# Patient Record
Sex: Male | Born: 1980 | Race: White | Hispanic: No | Marital: Single | Smoking: Former smoker
Health system: Southern US, Community
[De-identification: ages and names within clinical notes are randomized; demographics above are authoritative.]

## PROBLEM LIST (undated history)

## (undated) DIAGNOSIS — K810 Acute cholecystitis: Secondary | ICD-10-CM

## (undated) DIAGNOSIS — L03213 Periorbital cellulitis: Secondary | ICD-10-CM

## (undated) DIAGNOSIS — I10 Essential (primary) hypertension: Secondary | ICD-10-CM

## (undated) DIAGNOSIS — F988 Other specified behavioral and emotional disorders with onset usually occurring in childhood and adolescence: Secondary | ICD-10-CM

## (undated) DIAGNOSIS — J302 Other seasonal allergic rhinitis: Secondary | ICD-10-CM

## (undated) HISTORY — DX: Other seasonal allergic rhinitis: J30.2

## (undated) HISTORY — DX: Acute cholecystitis: K81.0

---

## 2012-01-27 ENCOUNTER — Encounter (HOSPITAL_COMMUNITY): Payer: Self-pay | Admitting: *Deleted

## 2012-01-27 ENCOUNTER — Emergency Department (HOSPITAL_COMMUNITY): Payer: PRIVATE HEALTH INSURANCE

## 2012-01-27 ENCOUNTER — Other Ambulatory Visit: Payer: Self-pay

## 2012-01-27 ENCOUNTER — Inpatient Hospital Stay (HOSPITAL_COMMUNITY)
Admission: EM | Admit: 2012-01-27 | Discharge: 2012-01-29 | DRG: 419 | Disposition: A | Discharge status: Home / Self Care | Payer: PRIVATE HEALTH INSURANCE | Attending: Surgery | Admitting: Surgery

## 2012-01-27 DIAGNOSIS — R0602 Shortness of breath: Secondary | ICD-10-CM | POA: Diagnosis present

## 2012-01-27 DIAGNOSIS — R1013 Epigastric pain: Secondary | ICD-10-CM

## 2012-01-27 DIAGNOSIS — K8000 Calculus of gallbladder with acute cholecystitis without obstruction: Principal | ICD-10-CM | POA: Diagnosis present

## 2012-01-27 DIAGNOSIS — K8010 Calculus of gallbladder with chronic cholecystitis without obstruction: Secondary | ICD-10-CM

## 2012-01-27 DIAGNOSIS — K810 Acute cholecystitis: Secondary | ICD-10-CM | POA: Diagnosis present

## 2012-01-27 DIAGNOSIS — R1011 Right upper quadrant pain: Secondary | ICD-10-CM | POA: Diagnosis present

## 2012-01-27 HISTORY — DX: Other specified behavioral and emotional disorders with onset usually occurring in childhood and adolescence: F98.8

## 2012-01-27 LAB — URINE MICROSCOPIC-ADD ON

## 2012-01-27 LAB — CBC
HCT: 42.6 % (ref 39.0–52.0)
Hemoglobin: 14.6 g/dL (ref 13.0–17.0)
MCHC: 34.3 g/dL (ref 30.0–36.0)
MCV: 85.7 fL (ref 78.0–100.0)
RDW: 12.5 % (ref 11.5–15.5)
WBC: 13.3 10*3/uL — ABNORMAL HIGH (ref 4.0–10.5)

## 2012-01-27 LAB — COMPREHENSIVE METABOLIC PANEL
AST: 45 U/L — ABNORMAL HIGH (ref 0–37)
BUN: 15 mg/dL (ref 6–23)
CO2: 26 mEq/L (ref 19–32)
Calcium: 10 mg/dL (ref 8.4–10.5)
Chloride: 98 mEq/L (ref 96–112)
Creatinine, Ser: 1.01 mg/dL (ref 0.50–1.35)
GFR calc Af Amer: >90 mL/min (ref >90)
GFR calc non Af Amer: >90 mL/min (ref >90)
Total Bilirubin: 0.7 mg/dL (ref 0.3–1.2)

## 2012-01-27 LAB — URINALYSIS, ROUTINE W REFLEX MICROSCOPIC
Glucose, UA: NEGATIVE mg/dL
Ketones, ur: 40 mg/dL — AB
Leukocytes, UA: NEGATIVE
Nitrite: NEGATIVE
Protein, ur: NEGATIVE mg/dL
Urobilinogen, UA: 1 mg/dL (ref 0.0–1.0)

## 2012-01-27 LAB — DIFFERENTIAL
Basophils Absolute: 0 10*3/uL (ref 0.0–0.1)
Eosinophils Relative: 0 % (ref 0–5)
Lymphocytes Relative: 8 % — ABNORMAL LOW (ref 12–46)
Monocytes Absolute: 0.6 10*3/uL (ref 0.1–1.0)
Monocytes Relative: 5 % (ref 3–12)

## 2012-01-27 LAB — LIPASE, BLOOD: Lipase: 22 U/L (ref 11–59)

## 2012-01-27 MED ORDER — MORPHINE SULFATE 4 MG/ML IJ SOLN
4.0000 mg | Freq: Once | INTRAMUSCULAR | Status: AC
  Administered 2012-01-27: 4 mg via INTRAVENOUS
  Filled 2012-01-27: qty 1

## 2012-01-27 MED ORDER — SODIUM CHLORIDE 0.9 % IV BOLUS (SEPSIS)
1000.0000 mL | Freq: Once | INTRAVENOUS | Status: AC
  Administered 2012-01-27: 1000 mL via INTRAVENOUS

## 2012-01-27 MED ORDER — DICYCLOMINE HCL 10 MG/ML IM SOLN
20.0000 mg | Freq: Once | INTRAMUSCULAR | Status: AC
  Administered 2012-01-27: 20 mg via INTRAMUSCULAR
  Filled 2012-01-27: qty 2

## 2012-01-27 MED ORDER — ONDANSETRON HCL 4 MG/2ML IJ SOLN
4.0000 mg | Freq: Once | INTRAMUSCULAR | Status: AC
  Administered 2012-01-27: 4 mg via INTRAVENOUS
  Filled 2012-01-27: qty 2

## 2012-01-27 NOTE — ED Provider Notes (Signed)
History     CSN: [id number]  Arrival date & time 01/27/12  1659   First MD Initiated Contact with Patient 01/27/12 1807      Chief Complaint  Patient presents with  . Abdominal Pain  . Shortness of Breath  . Fever    (Consider location/radiation/quality/duration/timing/severity/associated sxs/prior treatment) HPI Pt p/w abd pain mostly in the upper quad. This episode started this morning but has had prev episodes after eating in the past that are similar but do not last this long. Pt describes the pain as"indigestion". + nausea but no diarrhea, constipation, vomiting. Pt states difficult to take deep breaths due to pain in abd. No chest pain. Subjective fevers at home. No prev abd surgeries.  Past Medical History  Diagnosis Date  . ADD (attention deficit disorder)     History reviewed. No pertinent past surgical history.  History reviewed. No pertinent family history.  History  Substance Use Topics  . Smoking status: Never Smoker   . Smokeless tobacco: Never Used  . Alcohol Use: Yes     occasional      Review of Systems  Constitutional: Negative for fever and chills.  HENT: Negative for neck pain.   Respiratory: Positive for shortness of breath. Negative for chest tightness.   Cardiovascular: Negative for chest pain, palpitations and leg swelling.  Gastrointestinal: Positive for nausea, abdominal pain and abdominal distention. Negative for vomiting, diarrhea and constipation.  Genitourinary: Negative for dysuria and hematuria.  Musculoskeletal: Positive for back pain.  Skin: Negative for rash and wound.  Neurological: Negative for dizziness, weakness, numbness and headaches.    Allergies  Review of patient's allergies indicates no known allergies.  Home Medications   Current Outpatient Rx  Name Route Sig Dispense Refill  . ACETAMINOPHEN 160 MG/5ML PO SUSP Oral Take 500 mg by mouth every 4 (four) hours as needed. For pain    . CALCIUM CARBONATE ANTACID 500 MG  PO CHEW Oral Chew 1 tablet by mouth daily.    Marland Kitchen LISDEXAMFETAMINE DIMESYLATE 50 MG PO CAPS Oral Take 50 mg by mouth every morning.    . ADULT MULTIVITAMIN W/MINERALS CH Oral Take 1 tablet by mouth daily.    . AMPHETAMINE-DEXTROAMPHET ER 15 MG PO CP24 Oral Take 15 mg by mouth See admin instructions. Take 2 capsules in the morning and 2 capsules at noon    . AMPHETAMINE-DEXTROAMPHETAMINE 20 MG PO TABS Oral Take 20 mg by mouth 4 (four) times daily.    Marland Kitchen HYDROMORPHONE HCL 2 MG PO TABS Oral Take 1 tablet (2 mg total) by mouth every 12 (twelve) hours as needed for pain. 20 tablet 0    BP 125/83  Pulse 89  Temp(Src) 98.4 F (36.9 C) (Axillary)  Resp 18  Ht 5\' 10"  (1.778 m)  Wt 185 lb (83.915 kg)  BMI 26.54 kg/m2  SpO2 97%  Physical Exam  Nursing note and vitals reviewed. Constitutional: He is oriented to person, place, and time. He appears well-developed and well-nourished. No distress.  HENT:  Head: Normocephalic and atraumatic.  Mouth/Throat: Oropharynx is clear and moist.  Eyes: EOM are normal. Pupils are equal, round, and reactive to light.  Neck: Normal range of motion. Neck supple.  Cardiovascular: Normal rate and regular rhythm.   Pulmonary/Chest: Effort normal and breath sounds normal. No respiratory distress. He has no wheezes. He has no rales. He exhibits no tenderness.  Abdominal: Soft. Bowel sounds are normal. He exhibits distension. There is tenderness (RUQ ttp>epigastric TTP. no rebound or guarding).  There is no rebound and no guarding.  Musculoskeletal: Normal range of motion. He exhibits no edema and no tenderness.  Neurological: He is alert and oriented to person, place, and time.  Skin: Skin is warm and dry. No rash noted. No erythema.  Psychiatric: He has a normal mood and affect. His behavior is normal.    ED Course  Procedures (including critical care time)  Labs Reviewed  CBC - Abnormal; Notable for the following:    WBC 13.3 (*)    All other components within  normal limits  DIFFERENTIAL - Abnormal; Notable for the following:    Neutrophils Relative 87 (*)    Neutro Abs 11.6 (*)    Lymphocytes Relative 8 (*)    All other components within normal limits  COMPREHENSIVE METABOLIC PANEL - Abnormal; Notable for the following:    Glucose, Bld 138 (*)    AST 45 (*)    ALT 76 (*)    Alkaline Phosphatase 150 (*)    All other components within normal limits  URINALYSIS, ROUTINE W REFLEX MICROSCOPIC - Abnormal; Notable for the following:    APPearance TURBID (*)    Ketones, ur 40 (*)    All other components within normal limits  SURGICAL PCR SCREEN - Abnormal; Notable for the following:    Staphylococcus aureus POSITIVE (*)    All other components within normal limits  LIPASE, BLOOD  URINE MICROSCOPIC-ADD ON  SURGICAL PATHOLOGY  LAB REPORT - SCANNED   Dg Cholangiogram Operative  01/28/2012  *RADIOLOGY REPORT*  Clinical Data:   Cholelithiasis  INTRAOPERATIVE CHOLANGIOGRAM  Technique:  Cholangiographic images from the C-arm fluoroscopic device were submitted for interpretation post-operatively.  Please see the procedural report for the amount of contrast and the fluoroscopy time utilized.  Comparison:  None  Findings:  No persistent filling defects in the common duct. Intrahepatic ducts are incompletely visualized, appearing decompressed centrally. Contrast passes into the duodenum.  IMPRESSION  Negative for retained common duct stone.  Original Report Authenticated By: Osa Craver, M.D.     1. Acute cholecystitis       MDM  Surgery to see in ED.        Loren Racer, MD 01/30/12 [id number]

## 2012-01-27 NOTE — ED Notes (Addendum)
Pt present to ED sinus brady, HTN, feeling hot, and tachypnea.  c/o cramping central and epigastric, abdominal pain 9/10, continuous, bowel sound present, not passing gas, abdominal non-distended but firm. Slight tenderness upon palpation. LBM yesterday, no diarrhea, positive nausea, no vomiting. No abdominal surgery in past, all organ present. HR 45-56, baseline HR unknown, BP 183/96, denied hx of HTN, breath sound clear.

## 2012-01-27 NOTE — ED Notes (Signed)
Pt from home with reports of LLQ pain that radiates up through chest and into back, EKG done shortly upon arrival and shown to Dr. Ranae Palms. Pt also c/o shortness of breath and chills, all symptoms started this morning and pt also endorses similar symptoms in the past but not to this extent.

## 2012-01-27 NOTE — ED Notes (Signed)
Dr  Yelverton in room.  

## 2012-01-27 NOTE — ED Notes (Signed)
[place] at bedside. Mother also at bedside

## 2012-01-28 ENCOUNTER — Other Ambulatory Visit: Payer: Self-pay

## 2012-01-28 ENCOUNTER — Encounter (HOSPITAL_COMMUNITY): Payer: Self-pay | Admitting: Anesthesiology

## 2012-01-28 ENCOUNTER — Inpatient Hospital Stay (HOSPITAL_COMMUNITY): Payer: PRIVATE HEALTH INSURANCE

## 2012-01-28 ENCOUNTER — Encounter (HOSPITAL_COMMUNITY): Payer: Self-pay | Admitting: General Surgery

## 2012-01-28 ENCOUNTER — Inpatient Hospital Stay (HOSPITAL_COMMUNITY): Payer: PRIVATE HEALTH INSURANCE | Admitting: Anesthesiology

## 2012-01-28 ENCOUNTER — Encounter (HOSPITAL_COMMUNITY): Admission: EM | Disposition: A | Discharge status: Home / Self Care | Payer: Self-pay | Source: Home / Self Care

## 2012-01-28 DIAGNOSIS — K810 Acute cholecystitis: Secondary | ICD-10-CM

## 2012-01-28 HISTORY — PX: CHOLECYSTECTOMY: SHX55

## 2012-01-28 HISTORY — DX: Acute cholecystitis: K81.0

## 2012-01-28 LAB — SURGICAL PCR SCREEN: Staphylococcus aureus: POSITIVE — AB

## 2012-01-28 SURGERY — LAPAROSCOPIC CHOLECYSTECTOMY WITH INTRAOPERATIVE CHOLANGIOGRAM
Anesthesia: General | Site: Abdomen

## 2012-01-28 MED ORDER — HEPARIN SODIUM (PORCINE) 5000 UNIT/ML IJ SOLN
5000.0000 [IU] | Freq: Three times a day (TID) | INTRAMUSCULAR | Status: DC
  Administered 2012-01-28 – 2012-01-29 (×3): 5000 [IU] via SUBCUTANEOUS
  Filled 2012-01-28 (×6): qty 1

## 2012-01-28 MED ORDER — ONDANSETRON HCL 4 MG/2ML IJ SOLN: 4.0000 mg | Freq: Four times a day (QID) | INTRAMUSCULAR | Status: DC | PRN

## 2012-01-28 MED ORDER — ROCURONIUM BROMIDE 100 MG/10ML IV SOLN
INTRAVENOUS | Status: DC | PRN
  Administered 2012-01-28: 10 mg via INTRAVENOUS
  Administered 2012-01-28: 30 mg via INTRAVENOUS

## 2012-01-28 MED ORDER — MIDAZOLAM HCL 5 MG/5ML IJ SOLN
INTRAMUSCULAR | Status: DC | PRN
  Administered 2012-01-28: 2 mg via INTRAVENOUS

## 2012-01-28 MED ORDER — POTASSIUM CHLORIDE IN NACL 20-0.9 MEQ/L-% IV SOLN
INTRAVENOUS | Status: DC
  Administered 2012-01-28: 03:00:00 via INTRAVENOUS
  Filled 2012-01-28 (×4): qty 1000

## 2012-01-28 MED ORDER — MORPHINE SULFATE 2 MG/ML IJ SOLN
2.0000 mg | INTRAMUSCULAR | Status: DC | PRN
  Administered 2012-01-28 (×3): 4 mg via INTRAVENOUS
  Filled 2012-01-28 (×3): qty 2

## 2012-01-28 MED ORDER — SODIUM CHLORIDE 0.9 % IV SOLN
1.0000 g | INTRAVENOUS | Status: DC
  Administered 2012-01-28 – 2012-01-29 (×2): 1 g via INTRAVENOUS
  Filled 2012-01-28 (×2): qty 1

## 2012-01-28 MED ORDER — FENTANYL CITRATE 0.05 MG/ML IJ SOLN
INTRAMUSCULAR | Status: AC
  Filled 2012-01-28: qty 2

## 2012-01-28 MED ORDER — DEXAMETHASONE SODIUM PHOSPHATE 10 MG/ML IJ SOLN
INTRAMUSCULAR | Status: DC | PRN
  Administered 2012-01-28: 10 mg via INTRAVENOUS

## 2012-01-28 MED ORDER — ONDANSETRON HCL 4 MG/2ML IJ SOLN
INTRAMUSCULAR | Status: DC | PRN
  Administered 2012-01-28: 4 mg via INTRAVENOUS

## 2012-01-28 MED ORDER — ACETAMINOPHEN 10 MG/ML IV SOLN
INTRAVENOUS | Status: DC | PRN
  Administered 2012-01-28: 1000 mg via INTRAVENOUS

## 2012-01-28 MED ORDER — FENTANYL CITRATE 0.05 MG/ML IJ SOLN
INTRAMUSCULAR | Status: DC | PRN
  Administered 2012-01-28: 50 ug via INTRAVENOUS
  Administered 2012-01-28: 100 ug via INTRAVENOUS
  Administered 2012-01-28 (×3): 50 ug via INTRAVENOUS

## 2012-01-28 MED ORDER — BUPIVACAINE HCL (PF) 0.25 % IJ SOLN
INTRAMUSCULAR | Status: AC
  Filled 2012-01-28: qty 30

## 2012-01-28 MED ORDER — SUCCINYLCHOLINE CHLORIDE 20 MG/ML IJ SOLN
INTRAMUSCULAR | Status: DC | PRN
  Administered 2012-01-28: 100 mg via INTRAVENOUS

## 2012-01-28 MED ORDER — ACETAMINOPHEN 10 MG/ML IV SOLN
INTRAVENOUS | Status: AC
  Filled 2012-01-28: qty 100

## 2012-01-28 MED ORDER — LIDOCAINE HCL (CARDIAC) 20 MG/ML IV SOLN
INTRAVENOUS | Status: DC | PRN
  Administered 2012-01-28: 50 mg via INTRAVENOUS

## 2012-01-28 MED ORDER — LACTATED RINGERS IR SOLN
Status: DC | PRN
  Administered 2012-01-28: 1

## 2012-01-28 MED ORDER — HYDROMORPHONE HCL PF 2 MG/ML IJ SOLN
2.0000 mg | INTRAMUSCULAR | Status: DC | PRN
  Administered 2012-01-28 – 2012-01-29 (×7): 2 mg via INTRAVENOUS
  Filled 2012-01-28 (×7): qty 1

## 2012-01-28 MED ORDER — LACTATED RINGERS IV SOLN
INTRAVENOUS | Status: DC | PRN
  Administered 2012-01-28 (×2): via INTRAVENOUS

## 2012-01-28 MED ORDER — MORPHINE SULFATE 4 MG/ML IJ SOLN
4.0000 mg | Freq: Once | INTRAMUSCULAR | Status: AC
  Administered 2012-01-28: 4 mg via INTRAVENOUS
  Filled 2012-01-28: qty 1

## 2012-01-28 MED ORDER — FENTANYL CITRATE 0.05 MG/ML IJ SOLN
25.0000 ug | INTRAMUSCULAR | Status: DC | PRN
  Administered 2012-01-28 (×2): 50 ug via INTRAVENOUS

## 2012-01-28 MED ORDER — IOHEXOL 300 MG/ML  SOLN
INTRAMUSCULAR | Status: AC
  Filled 2012-01-28: qty 1

## 2012-01-28 MED ORDER — LACTATED RINGERS IV SOLN: INTRAVENOUS | Status: DC

## 2012-01-28 MED ORDER — PANTOPRAZOLE SODIUM 40 MG IV SOLR
40.0000 mg | Freq: Every day | INTRAVENOUS | Status: DC
  Administered 2012-01-28 (×2): 40 mg via INTRAVENOUS
  Filled 2012-01-28 (×3): qty 40

## 2012-01-28 MED ORDER — PROPOFOL 10 MG/ML IV BOLUS
INTRAVENOUS | Status: DC | PRN
  Administered 2012-01-28: 170 mg via INTRAVENOUS

## 2012-01-28 MED ORDER — PROMETHAZINE HCL 25 MG/ML IJ SOLN: 6.2500 mg | INTRAMUSCULAR | Status: DC | PRN

## 2012-01-28 MED ORDER — OXYCODONE-ACETAMINOPHEN 5-325 MG PO TABS
1.0000 | ORAL_TABLET | ORAL | Status: DC | PRN
  Administered 2012-01-28: 1 via ORAL
  Filled 2012-01-28: qty 1

## 2012-01-28 MED ORDER — SODIUM CHLORIDE 0.9 % IJ SOLN
INTRAMUSCULAR | Status: DC | PRN
  Administered 2012-01-28: 11:00:00

## 2012-01-28 MED ORDER — GLYCOPYRROLATE 0.2 MG/ML IJ SOLN
INTRAMUSCULAR | Status: DC | PRN
  Administered 2012-01-28: 0.6 mg via INTRAVENOUS

## 2012-01-28 MED ORDER — IOHEXOL 300 MG/ML  SOLN
100.0000 mL | Freq: Once | INTRAMUSCULAR | Status: AC | PRN
  Administered 2012-01-28: 100 mL via INTRAVENOUS

## 2012-01-28 MED ORDER — BUPIVACAINE HCL (PF) 0.25 % IJ SOLN
INTRAMUSCULAR | Status: DC | PRN
  Administered 2012-01-28: 10 mL

## 2012-01-28 MED ORDER — NEOSTIGMINE METHYLSULFATE 1 MG/ML IJ SOLN
INTRAMUSCULAR | Status: DC | PRN
  Administered 2012-01-28: 4 mg via INTRAVENOUS

## 2012-01-28 SURGICAL SUPPLY — 44 items
ADH SKN CLS APL DERMABOND .7 (GAUZE/BANDAGES/DRESSINGS)
APPLIER CLIP ROT 10 11.4 M/L (STAPLE) ×2
APR CLP MED LRG 11.4X10 (STAPLE) ×1
BAG SPEC RTRVL LRG 6X4 10 (ENDOMECHANICALS) ×1
CANISTER SUCTION 2500CC (MISCELLANEOUS) ×2 IMPLANT
CATH REDDICK CHOLANGI 4FR 50CM (CATHETERS) IMPLANT
CHLORAPREP W/TINT 10.5 ML (MISCELLANEOUS) ×2 IMPLANT
CLIP APPLIE ROT 10 11.4 M/L (STAPLE) ×1 IMPLANT
CLOTH BEACON ORANGE TIMEOUT ST (SAFETY) ×2 IMPLANT
COVER MAYO STAND STRL (DRAPES) ×1 IMPLANT
DECANTER SPIKE VIAL GLASS SM (MISCELLANEOUS) ×2 IMPLANT
DERMABOND ADVANCED (GAUZE/BANDAGES/DRESSINGS)
DERMABOND ADVANCED .7 DNX12 (GAUZE/BANDAGES/DRESSINGS) IMPLANT
DRAPE C-ARM 42X72 X-RAY (DRAPES) ×1 IMPLANT
DRAPE LAPAROSCOPIC ABDOMINAL (DRAPES) ×2 IMPLANT
ELECT REM PT RETURN 9FT ADLT (ELECTROSURGICAL) ×2
ELECTRODE REM PT RTRN 9FT ADLT (ELECTROSURGICAL) ×1 IMPLANT
FILTER SMOKE EVAC LAPAROSHD (FILTER) ×2 IMPLANT
GLOVE BIOGEL PI IND STRL 7.0 (GLOVE) ×1 IMPLANT
GLOVE BIOGEL PI INDICATOR 7.0 (GLOVE) ×2
GLOVE EUDERMIC 7 POWDERFREE (GLOVE) ×2 IMPLANT
GOWN STRL NON-REIN LRG LVL3 (GOWN DISPOSABLE) ×3 IMPLANT
GOWN STRL REIN XL XLG (GOWN DISPOSABLE) ×4 IMPLANT
HEMOSTAT SNOW SURGICEL 2X4 (HEMOSTASIS) ×1 IMPLANT
HEMOSTAT SURGICEL 4X8 (HEMOSTASIS) IMPLANT
IV CATH 14GX2 1/4 (CATHETERS) IMPLANT
IV SET EXT 30 76VOL 4 MALE LL (IV SETS) IMPLANT
KIT BASIN OR (CUSTOM PROCEDURE TRAY) ×2 IMPLANT
NS IRRIG 1000ML POUR BTL (IV SOLUTION) ×2 IMPLANT
POUCH SPECIMEN RETRIEVAL 10MM (ENDOMECHANICALS) ×1 IMPLANT
SCISSORS LAP 5X35 DISP (ENDOMECHANICALS) ×1 IMPLANT
SET CHOLANGIOGRAPH MIX (MISCELLANEOUS) ×2 IMPLANT
SET IRRIG TUBING LAPAROSCOPIC (IRRIGATION / IRRIGATOR) ×2 IMPLANT
SLEEVE Z-THREAD 5X100MM (TROCAR) ×2 IMPLANT
SOLUTION ANTI FOG 6CC (MISCELLANEOUS) ×2 IMPLANT
STOPCOCK K 69 2C6206 (IV SETS) IMPLANT
STRIP CLOSURE SKIN 1/4X3 (GAUZE/BANDAGES/DRESSINGS) ×4 IMPLANT
SUT MNCRL AB 4-0 PS2 18 (SUTURE) ×2 IMPLANT
TOWEL OR 17X26 10 PK STRL BLUE (TOWEL DISPOSABLE) ×2 IMPLANT
TRAY LAP CHOLE (CUSTOM PROCEDURE TRAY) ×2 IMPLANT
TROCAR XCEL BLUNT TIP 100MML (ENDOMECHANICALS) ×2 IMPLANT
TROCAR Z-THREAD FIOS 11X100 BL (TROCAR) ×2 IMPLANT
TROCAR Z-THREAD FIOS 5X100MM (TROCAR) ×2 IMPLANT
TUBING INSUFFLATION 10FT LAP (TUBING) ×2 IMPLANT

## 2012-01-28 NOTE — Transfer of Care (Signed)
Immediate Anesthesia Transfer of Care Note  Patient: David Cordova  Procedure(s) Performed: Procedure(s) (LRB): LAPAROSCOPIC CHOLECYSTECTOMY WITH INTRAOPERATIVE CHOLANGIOGRAM (N/A)  Patient Location: PACU  Anesthesia Type: General  Level of Consciousness: sedated  Airway & Oxygen Therapy: Patient Spontanous Breathing and Patient connected to face mask oxygen  Post-op Assessment: Report given to PACU RN and Post -op Vital signs reviewed and stable  Post vital signs: Reviewed and stable  Complications: No apparent anesthesia complications

## 2012-01-28 NOTE — Op Note (Signed)
David Cordova 1981-07-26 [id number] 01/27/2012  Preoperative diagnosis: Acute cholecystitis  Postoperative diagnosis: Acute cholecystitis  Procedure: Laparoscopic cholecystectomy with intraoperative cholangiogram  Surgeon: Currie Paris, MD, FACS  Assistant surgeon: Dr. Avel Peace   Anesthesia: General  Clinical History and Indications: This patient presented to the emergency department yesterday with signs and symptoms of acute cholecystitis. There is not clearly gallstones but he had what appeared to be sludge in the gallbladder and some pericholecystic fluid on CT scan.  Description of procedure: The patient was seen in the preoperative area. I reviewed the plans for the procedure with him as well as the risks and complications. He had no further questions.  The patient was taken to the operating room. After satisfactory general endotracheal anesthesia had been obtained the abdomen was prepped and draped. A time out was done.  0.25% plain Marcaine was used at all incisions. I made an umbilical incision, identified the fascia and opened that, and entered the peritoneal cavity under direct vision. A 0 Vicryl pursestring suture was placed and the Hasson cannula was introduced under direct vision and secured with the pursestring. The abdomen was inflated to 15 cm.  The camera was placed and there were no gross abnormalities. The patient was then placed in reverse Trendelenburg and tilted to the left. A 10/11 trocar was placed in the epigastrium and two 5 mm trochars placed laterally all under direct vision.  The gallbladder was acutely inflamed and could not be grasped so I decompressed it with a needle aspirator. The gallbladder was retracted over the liver and the peritoneum opened over the triangle of cola. There is marked edema present. However I was able to use some blunt dissection and hydrodissection identify the cystic artery and duct. I thought the artery was anterior  branch. I placed a clip on the anterior branch of the artery and the cystic duct.  An intraoperative cholangiogram was then performed. A Cook catheter was introduced percutaneously and placed in the cystic duct. The cholangiogram showed good filling of the common duct and hepatic radicals and free flow into the duodenum. No abnormalities were noted.  The catheter was removed and 3 clips placed on the stay side of the cystic duct. The duct was then divided.  Additional clips are placed on the cystic artery and it was divided. The gallbladder was then removed from below to above the coagulation current of the cautery.Some other arterial branches to the Gallbladder were divided. It was then placed in a bag to be retrieved later.  The abdomen was irrigated and a check for hemostasis along the bed of the gallbladder made. Once everything appeared to be dry I placed some Surgicel on the bed of the gallbladder. we were able to move the camera to the epigastric port and removed the gallbladder through the umbilical port.  The abdomen was reinsufflated and a final check for hemostasis made. There is no evidence of bleeding or bile leakage. The lateral ports were removed under direct vision and there was no bleeding. The umbilical site was closed with a pursestring, watching with the camera in the epigastric port. The abdomen was then deflated through the epigastric port and that was removed. Skin was closed with 4-0 Monocryl subcuticular and Dermabond.  The patient tolerated the procedure well. There were no operative complications. EBL was minimal. All counts were correct.  Currie Paris, MD, FACS 01/28/2012 11:36 AM

## 2012-01-28 NOTE — ED Notes (Signed)
Patient transported to CT 

## 2012-01-28 NOTE — Progress Notes (Signed)
<principal problem not specified>  Subjective: Still with abdominal pain, primarily RUQ, no nausea, not improved from admission  Objective: Vital signs in last 24 hours: Temp:  [97.4 F (36.3 C)-99.1 F (37.3 C)] 99.1 F (37.3 C) (04/08 0557) Pulse Rate:  [42-114] 95  (04/08 0557) Resp:  [13-25] 18  (04/08 0557) BP: (143-189)/(90-110) 168/98 mmHg (04/08 0557) SpO2:  [95 %-100 %] 95 % (04/08 0557) Weight:  [185 lb (83.915 kg)] 185 lb (83.915 kg) (04/08 0349) Last BM Date: 01/27/12  Intake/Output from previous day: 04/07 0701 - 04/08 0700 In: 425 [I.V.:375; IV Piggyback:50] Out: 225 [Urine:225] Intake/Output this shift:    General appearance: alert, cooperative, appears stated age and mild distress GI: abnormal findings:  Tender mainly RUQ with some voluntary guarding  Lab Results:  Results for orders placed during the hospital encounter of 01/27/12 (from the past 24 hour(s))  CBC     Status: Abnormal   Collection Time   01/27/12  7:17 PM      Component Value Range   WBC 13.3 (*) 4.0 - 10.5 (K/uL)   RBC 4.97  4.22 - 5.81 (MIL/uL)   Hemoglobin 14.6  13.0 - 17.0 (g/dL)   HCT 11.9  14.7 - 82.9 (%)   MCV 85.7  78.0 - 100.0 (fL)   MCH 29.4  26.0 - 34.0 (pg)   MCHC 34.3  30.0 - 36.0 (g/dL)   RDW 56.2  13.0 - 86.5 (%)   Platelets 231  150 - 400 (K/uL)  DIFFERENTIAL     Status: Abnormal   Collection Time   01/27/12  7:17 PM      Component Value Range   Neutrophils Relative 87 (*) 43 - 77 (%)   Neutro Abs 11.6 (*) 1.7 - 7.7 (K/uL)   Lymphocytes Relative 8 (*) 12 - 46 (%)   Lymphs Abs 1.1  0.7 - 4.0 (K/uL)   Monocytes Relative 5  3 - 12 (%)   Monocytes Absolute 0.6  0.1 - 1.0 (K/uL)   Eosinophils Relative 0  0 - 5 (%)   Eosinophils Absolute 0.0  0.0 - 0.7 (K/uL)   Basophils Relative 0  0 - 1 (%)   Basophils Absolute 0.0  0.0 - 0.1 (K/uL)  COMPREHENSIVE METABOLIC PANEL     Status: Abnormal   Collection Time   01/27/12  7:17 PM      Component Value Range   Sodium 136  135 -  145 (mEq/L)   Potassium 3.6  3.5 - 5.1 (mEq/L)   Chloride 98  96 - 112 (mEq/L)   CO2 26  19 - 32 (mEq/L)   Glucose, Bld 138 (*) 70 - 99 (mg/dL)   BUN 15  6 - 23 (mg/dL)   Creatinine, Ser 7.84  0.50 - 1.35 (mg/dL)   Calcium 69.6  8.4 - 10.5 (mg/dL)   Total Protein 7.9  6.0 - 8.3 (g/dL)   Albumin 4.7  3.5 - 5.2 (g/dL)   AST 45 (*) 0 - 37 (U/L)   ALT 76 (*) 0 - 53 (U/L)   Alkaline Phosphatase 150 (*) 39 - 117 (U/L)   Total Bilirubin 0.7  0.3 - 1.2 (mg/dL)   GFR calc non Af Amer >90  >90 (mL/min)   GFR calc Af Amer >90  >90 (mL/min)  LIPASE, BLOOD     Status: Normal   Collection Time   01/27/12  7:17 PM      Component Value Range   Lipase 22  11 - 59 (  U/L)  URINALYSIS, ROUTINE W REFLEX MICROSCOPIC     Status: Abnormal   Collection Time   01/27/12  7:37 PM      Component Value Range   Color, Urine YELLOW  YELLOW    APPearance TURBID (*) CLEAR    Specific Gravity, Urine 1.024  1.005 - 1.030    pH 7.5  5.0 - 8.0    Glucose, UA NEGATIVE  NEGATIVE (mg/dL)   Hgb urine dipstick NEGATIVE  NEGATIVE    Bilirubin Urine NEGATIVE  NEGATIVE    Ketones, ur 40 (*) NEGATIVE (mg/dL)   Protein, ur NEGATIVE  NEGATIVE (mg/dL)   Urobilinogen, UA 1.0  0.0 - 1.0 (mg/dL)   Nitrite NEGATIVE  NEGATIVE    Leukocytes, UA NEGATIVE  NEGATIVE   URINE MICROSCOPIC-ADD ON     Status: Normal   Collection Time   01/27/12  7:37 PM      Component Value Range   WBC, UA 0-2  <3 (WBC/hpf)   Urine-Other AMORPHOUS URATES/PHOSPHATES       Studies/Results Radiology     MEDS, Scheduled    . dicyclomine  20 mg Intramuscular Once  . ertapenem (INVANZ) IV  1 g Intravenous Q24H  .  morphine injection  4 mg Intravenous Once  .  morphine injection  4 mg Intravenous Once  .  morphine injection  4 mg Intravenous Once  .  morphine injection  4 mg Intravenous Once  . ondansetron  4 mg Intravenous Once  . pantoprazole (PROTONIX) IV  40 mg Intravenous QHS  . sodium chloride  1,000 mL Intravenous Once      Assessment: <principal problem not specified> Probably acute cholecystitits  Plan: I have reviewed his labs, xrays and progress notes. This is most consistent with an acute cholecystitis. He has had mild symptoms for about a year, now an acute exacerbation. I have recommended cholecystectomy. I have discussed the indications for laparoscopic cholecystectomy with him. We have discussed the risks of surgery, including general risks such as bleeding, infection, lung and heart issues etc. We have also discussed the potential for injuries to other organs, bile duct leaks, and other unexpected events. We have also talked about the fact that this may need to be converted to open under certain circumstances. We discussed the typical post op recovery and the fact that there is a good likelihood of improvement in symptoms and return to normal activity.  He understands this and wishes to proceed to schedule surgery. I believe all of his questions have been answered.     LOS: 1 day    Currie Paris, MD, [facility], [place] [phone]   01/28/2012 7:37 AM

## 2012-01-28 NOTE — H&P (Signed)
David Cordova is an 31 y.o. male.   Chief Complaint: abdominal pain HPI: patient is a 31 year old male who presents to the emergency room with acute abdominal pain. He describes the onset about 18 hours ago of gradually worsening pressure-like pain in his mid abdomen that gradually radiates up toward his chest and both costal margins. He states that for about 2 years he has had milder episodes of similar pain which will last for a few hours and then go away. These are only occurring every month or 2. This is more severe a long-lasting. It is associated with nausea but no vomiting. Bowel movements have been normal. He denies fever chills or jaundice. No particular alleviating or exacerbating factors.  Past Medical History  Diagnosis Date  . ADD (attention deficit disorder)     History reviewed. No pertinent past surgical history.  Current Facility-Administered Medications  Medication Dose Route Frequency Provider Last Rate Last Dose  . dicyclomine (BENTYL) injection 20 mg  20 mg Intramuscular Once Loren Racer, MD   20 mg at 01/27/12 1848  . iohexol (OMNIPAQUE) 300 MG/ML solution 100 mL  100 mL Intravenous Once PRN Medication Radiologist, MD   100 mL at 01/28/12 0040  . morphine 4 MG/ML injection 4 mg  4 mg Intravenous Once Loren Racer, MD   4 mg at 01/27/12 1846  . morphine 4 MG/ML injection 4 mg  4 mg Intravenous Once Loren Racer, MD   4 mg at 01/27/12 2053  . morphine 4 MG/ML injection 4 mg  4 mg Intravenous Once Loren Racer, MD   4 mg at 01/27/12 2322  . morphine 4 MG/ML injection 4 mg  4 mg Intravenous Once Loren Racer, MD   4 mg at 01/28/12 0123  . ondansetron (ZOFRAN) injection 4 mg  4 mg Intravenous Once Loren Racer, MD   4 mg at 01/27/12 1845  . sodium chloride 0.9 % bolus 1,000 mL  1,000 mL Intravenous Once Loren Racer, MD   1,000 mL at 01/27/12 1843   Current Outpatient Prescriptions  Medication Sig Dispense Refill  . acetaminophen (TYLENOL) 160 MG/5ML  suspension Take 500 mg by mouth every 4 (four) hours as needed. For pain      . calcium carbonate (TUMS - DOSED IN MG ELEMENTAL CALCIUM) 500 MG chewable tablet Chew 1 tablet by mouth daily.      Marland Kitchen lisdexamfetamine (VYVANSE) 50 MG capsule Take 50 mg by mouth every morning.      . Multiple Vitamin (MULITIVITAMIN WITH MINERALS) TABS Take 1 tablet by mouth daily.      Marland Kitchen amphetamine-dextroamphetamine (ADDERALL XR) 15 MG 24 hr capsule Take 15 mg by mouth See admin instructions. Take 2 capsules in the morning and 2 capsules at noon      . amphetamine-dextroamphetamine (ADDERALL) 20 MG tablet Take 20 mg by mouth 4 (four) times daily.        Social History:  reports that he has never smoked. He has never used smokeless tobacco. He reports that he drinks alcohol. He reports that he does not use illicit drugs.  Allergies: No Known Allergies  Medications Prior to Admission  Medication Dose Route Frequency Provider Last Rate Last Dose  . dicyclomine (BENTYL) injection 20 mg  20 mg Intramuscular Once Loren Racer, MD   20 mg at 01/27/12 1848  . iohexol (OMNIPAQUE) 300 MG/ML solution 100 mL  100 mL Intravenous Once PRN Medication Radiologist, MD   100 mL at 01/28/12 0040  . morphine 4 MG/ML  injection 4 mg  4 mg Intravenous Once Loren Racer, MD   4 mg at 01/27/12 1846  . morphine 4 MG/ML injection 4 mg  4 mg Intravenous Once Loren Racer, MD   4 mg at 01/27/12 2053  . morphine 4 MG/ML injection 4 mg  4 mg Intravenous Once Loren Racer, MD   4 mg at 01/27/12 2322  . morphine 4 MG/ML injection 4 mg  4 mg Intravenous Once Loren Racer, MD   4 mg at 01/28/12 0123  . ondansetron (ZOFRAN) injection 4 mg  4 mg Intravenous Once Loren Racer, MD   4 mg at 01/27/12 1845  . sodium chloride 0.9 % bolus 1,000 mL  1,000 mL Intravenous Once Loren Racer, MD   1,000 mL at 01/27/12 1843   No current outpatient prescriptions on file as of 01/27/2012.    Results for orders placed during the hospital  encounter of 01/27/12 (from the past 48 hour(s))  CBC     Status: Abnormal   Collection Time   01/27/12  7:17 PM      Component Value Range Comment   WBC 13.3 (*) 4.0 - 10.5 (K/uL)    RBC 4.97  4.22 - 5.81 (MIL/uL)    Hemoglobin 14.6  13.0 - 17.0 (g/dL)    HCT 56.2  13.0 - 86.5 (%)    MCV 85.7  78.0 - 100.0 (fL)    MCH 29.4  26.0 - 34.0 (pg)    MCHC 34.3  30.0 - 36.0 (g/dL)    RDW 78.4  69.6 - 29.5 (%)    Platelets 231  150 - 400 (K/uL)   DIFFERENTIAL     Status: Abnormal   Collection Time   01/27/12  7:17 PM      Component Value Range Comment   Neutrophils Relative 87 (*) 43 - 77 (%)    Neutro Abs 11.6 (*) 1.7 - 7.7 (K/uL)    Lymphocytes Relative 8 (*) 12 - 46 (%)    Lymphs Abs 1.1  0.7 - 4.0 (K/uL)    Monocytes Relative 5  3 - 12 (%)    Monocytes Absolute 0.6  0.1 - 1.0 (K/uL)    Eosinophils Relative 0  0 - 5 (%)    Eosinophils Absolute 0.0  0.0 - 0.7 (K/uL)    Basophils Relative 0  0 - 1 (%)    Basophils Absolute 0.0  0.0 - 0.1 (K/uL)   COMPREHENSIVE METABOLIC PANEL     Status: Abnormal   Collection Time   01/27/12  7:17 PM      Component Value Range Comment   Sodium 136  135 - 145 (mEq/L)    Potassium 3.6  3.5 - 5.1 (mEq/L)    Chloride 98  96 - 112 (mEq/L)    CO2 26  19 - 32 (mEq/L)    Glucose, Bld 138 (*) 70 - 99 (mg/dL)    BUN 15  6 - 23 (mg/dL)    Creatinine, Ser 2.84  0.50 - 1.35 (mg/dL)    Calcium 13.2  8.4 - 10.5 (mg/dL)    Total Protein 7.9  6.0 - 8.3 (g/dL)    Albumin 4.7  3.5 - 5.2 (g/dL)    AST 45 (*) 0 - 37 (U/L)    ALT 76 (*) 0 - 53 (U/L)    Alkaline Phosphatase 150 (*) 39 - 117 (U/L)    Total Bilirubin 0.7  0.3 - 1.2 (mg/dL)    GFR calc non Af Amer >90  >  90 (mL/min)    GFR calc Af Amer >90  >90 (mL/min)   LIPASE, BLOOD     Status: Normal   Collection Time   01/27/12  7:17 PM      Component Value Range Comment   Lipase 22  11 - 59 (U/L)   URINALYSIS, ROUTINE W REFLEX MICROSCOPIC     Status: Abnormal   Collection Time   01/27/12  7:37 PM      Component  Value Range Comment   Color, Urine YELLOW  YELLOW     APPearance TURBID (*) CLEAR     Specific Gravity, Urine 1.024  1.005 - 1.030     pH 7.5  5.0 - 8.0     Glucose, UA NEGATIVE  NEGATIVE (mg/dL)    Hgb urine dipstick NEGATIVE  NEGATIVE     Bilirubin Urine NEGATIVE  NEGATIVE     Ketones, ur 40 (*) NEGATIVE (mg/dL)    Protein, ur NEGATIVE  NEGATIVE (mg/dL)    Urobilinogen, UA 1.0  0.0 - 1.0 (mg/dL)    Nitrite NEGATIVE  NEGATIVE     Leukocytes, UA NEGATIVE  NEGATIVE    URINE MICROSCOPIC-ADD ON     Status: Normal   Collection Time   01/27/12  7:37 PM      Component Value Range Comment   WBC, UA 0-2  <3 (WBC/hpf)    Urine-Other AMORPHOUS URATES/PHOSPHATES      US Abdomen Complete  01/27/2012  *RADIOLOGY REPORT*  Clinical Data:  Right upper quadrant pain.  COMPLETE ABDOMINAL ULTRASOUND  Comparison:  Radiographs dated 01/27/2012  Findings:  Gallbladder:  There is a nonshadowing 1 cm echogenic lesion in the gallbladder which probably represents a sludge ball.  No gallbladder wall thickening.  Negative sonographic Murphy's sign. Tiny echogenic lesion along the gallbladder wall with comet tail sign consistent with adenomyosis.  Common bile duct:  Normal.  5.9 mm in diameter.  Liver:  Normal.  IVC:  Normal.  Pancreas:  Normal.  Spleen:  Normal.  9.9 cm in length.  Right Kidney:  Normal.  9.8 cm in length.  Left Kidney:  Normal.  11.4 cm in length.  Abdominal aorta:  Normal.  2.5 cm maximum diameter.  IMPRESSION: Probable small sludge ball in the neck of the gallbladder or gallbladder polyp.  This lesion does not move and does not shadow. Probable slight adenomyosis of the gallbladder.  Otherwise, normal exam.  Original Report Authenticated By: Gwynn Burly, M.D.   Ct Abdomen Pelvis W Contrast  01/28/2012  *RADIOLOGY REPORT*  Clinical Data: Central and epigastric pain for past day.  CT ABDOMEN AND PELVIS WITH CONTRAST  Technique:  Multidetector CT imaging of the abdomen and pelvis was performed  following the standard protocol during bolus administration of intravenous contrast.  Contrast: OMNIPAQUE IOHEXOL 300 MG/ML  SOLN  Comparison: Ultrasound 01/27/2012.  Findings: No extraluminal bowel inflammatory process, free fluid or free air.  Specifically, no inflammation surrounds the appendix. Portions of the proximal small bowel appear slightly thickened which may be related to under distension.  Fatty infiltration of the liver without focal hepatic lesion.  Dilated gallbladder with suggestion of minimal amount of pericholecystic fluid.  This raises possibility cholecystitis.  No focal splenic, pancreatic, renal or adrenal lesion.  Noncontrast filled views of the urinary bladder unremarkable. Degenerative changes L5-S1.  Lung bases clear.  IMPRESSION: Most notable abnormality on the present examination is the suggestion of minimal amount of pericholecystic fluid raising possibility cholecystitis.  This was discussed  with Dr. Ranae Palms.  Fatty infiltration liver.  Probable under distended loops of small bowel or peristalsis cause slightly thickened appearance of proximal small bowel loops rather than inflammation.  No CT findings to suggest appendiceal abnormality.  Original Report Authenticated By: Fuller Canada, M.D.   Dg Abd Acute W/chest  01/27/2012  *RADIOLOGY REPORT*  Clinical Data: Abdominal pain.  Nausea. Shortness of breath.  ACUTE ABDOMEN SERIES (ABDOMEN 2 VIEW & CHEST 1 VIEW)  Comparison: None.  Findings: Abnormal bowel gas pattern with gas and fluid filled slightly prominent size small bowel loops.  Question partial small bowel obstruction?  No free intraperitoneal air.  Minimal peribronchial thickening without segmental infiltrate. Heart size within normal limits.  IMPRESSION: Abnormal although nonspecific bowel gas pattern.  Question partial small bowel obstruction?  Original Report Authenticated By: Fuller Canada, M.D.    Review of Systems  Constitutional: Negative for fever,  chills and weight loss.  HENT: Negative.   Respiratory: Negative.   Cardiovascular: Negative.   Gastrointestinal: Positive for nausea and abdominal pain. Negative for heartburn, vomiting, diarrhea, constipation, blood in stool and melena.  Genitourinary: Negative.   Musculoskeletal: Negative.     Blood pressure 143/108, pulse 65, temperature 98.4 F (36.9 C), temperature source Oral, resp. rate 13, SpO2 100.00%. Physical Exam  General: Alert, well-developed Caucasian male, in no distress Skin: Warm and dry without rash or infection. HEENT: No palpable masses or thyromegaly. Sclera nonicteric. Pupils equal round and reactive. Oropharynx clear. Lymph nodes: No cervical, supraclavicular, or inguinal nodes palpable. Lungs: Breath sounds clear and equal without increased work of breathing Cardiovascular: Regular rate and rhythm without murmur. No JVD or edema. Peripheral pulses intact. Abdomen: Nondistended. There is mild tenderness in the epigastrium and slightly toward the right upper quadrant without guarding or peritoneal signs.. No masses palpable. No organomegaly. No palpable hernias. Extremities: No edema or joint swelling or deformity. No chronic venous stasis changes. Neurologic: Alert and fully oriented.   Assessment/Plan Likely early acute cholecystitis. He does not have definite stones but does have a "sludge ball" in the gallbladder with pericholecystic fluid seen on CT scan. His symptoms are typical for biliary tract disease and he has mildly elevated transaminases and white count. He continues to have some pain despite medication in the emergency room. He will be admitted and placed on broad-spectrum antibiotics and plan laparoscopic cholecystectomy.  Arshawn Valdez T 01/28/2012, 2:17 AM

## 2012-01-28 NOTE — Anesthesia Postprocedure Evaluation (Signed)
Anesthesia Post Note  Patient: David Cordova  Procedure(s) Performed: Procedure(s) (LRB): LAPAROSCOPIC CHOLECYSTECTOMY WITH INTRAOPERATIVE CHOLANGIOGRAM (N/A)  Anesthesia type: General  Patient location: PACU  Post pain: Pain level controlled  Post assessment: Post-op Vital signs reviewed  Last Vitals:  Filed Vitals:   01/28/12 1220  BP:   Pulse: 79  Temp:   Resp: 15    Post vital signs: Reviewed  Level of consciousness: sedated  Complications: No apparent anesthesia complications

## 2012-01-28 NOTE — Anesthesia Preprocedure Evaluation (Addendum)
Anesthesia Evaluation  Patient identified by MRN, date of birth, ID band Patient awake    Reviewed: Allergy & Precautions, H&P , NPO status , Patient's Chart, lab work & pertinent test results  Airway Mallampati: II TM Distance: >3 FB Neck ROM: Full    Dental  (+) Teeth Intact and Dental Advisory Given   Pulmonary neg pulmonary ROS,  breath sounds clear to auscultation  Pulmonary exam normal       Cardiovascular negative cardio ROS  Rhythm:Regular     Neuro/Psych PSYCHIATRIC DISORDERS negative neurological ROS  negative psych ROS   GI/Hepatic negative GI ROS, Neg liver ROS,   Endo/Other  negative endocrine ROS  Renal/GU negative Renal ROS  negative genitourinary   Musculoskeletal negative musculoskeletal ROS (+)   Abdominal   Peds  Hematology negative hematology ROS (+)   Anesthesia Other Findings   Reproductive/Obstetrics negative OB ROS                          Anesthesia Physical Anesthesia Plan  ASA: I and Emergent  Anesthesia Plan: General   Post-op Pain Management:    Induction: Intravenous  Airway Management Planned: Oral ETT  Additional Equipment:   Intra-op Plan:   Post-operative Plan: Extubation in OR  Informed Consent: I have reviewed the patients History and Physical, chart, labs and discussed the procedure including the risks, benefits and alternatives for the proposed anesthesia with the patient or authorized representative who has indicated his/her understanding and acceptance.   Dental advisory given  Plan Discussed with: CRNA  Anesthesia Plan Comments:         Anesthesia Quick Evaluation

## 2012-01-29 MED ORDER — HYDROCODONE-ACETAMINOPHEN 5-325 MG PO TABS
1.0000 | ORAL_TABLET | ORAL | Status: DC | PRN
  Administered 2012-01-29: 2 via ORAL
  Filled 2012-01-29: qty 2

## 2012-01-29 MED ORDER — DIPHENHYDRAMINE HCL 25 MG PO CAPS
25.0000 mg | ORAL_CAPSULE | Freq: Four times a day (QID) | ORAL | Status: DC | PRN
  Administered 2012-01-29: 25 mg via ORAL
  Filled 2012-01-29: qty 1

## 2012-01-29 MED ORDER — DIPHENHYDRAMINE HCL 50 MG/ML IJ SOLN: 12.5000 mg | Freq: Four times a day (QID) | INTRAMUSCULAR | Status: DC | PRN

## 2012-01-29 MED ORDER — NAPROXEN 500 MG PO TABS
500.0000 mg | ORAL_TABLET | Freq: Four times a day (QID) | ORAL | Status: DC | PRN
  Filled 2012-01-29: qty 1

## 2012-01-29 MED ORDER — HYDROMORPHONE HCL 2 MG PO TABS: 2.0000 mg | ORAL_TABLET | Freq: Two times a day (BID) | ORAL | Status: DC | PRN

## 2012-01-29 NOTE — Discharge Instructions (Signed)
Laparoscopic Cholecystectomy Laparoscopic cholecystectomy is surgery to remove the gallbladder. The gallbladder is located slightly to the right of center in the abdomen, behind the liver. It is a concentrating and storage sac for the bile produced in the liver. Bile aids in the digestion and absorption of fats. Gallbladder disease (cholecystitis) is an inflammation of your gallbladder. This condition is usually caused by a buildup of gallstones (cholelithiasis) in your gallbladder. Gallstones can block the flow of bile, resulting in inflammation and pain. In severe cases, emergency surgery may be required. When emergency surgery is not required, you will have time to prepare for the procedure. Laparoscopic surgery is an alternative to open surgery. Laparoscopic surgery usually has a shorter recovery time. Your common bile duct may also need to be examined and explored. Your caregiver will discuss this with you if he or she feels this should be done. If stones are found in the common bile duct, they may be removed. LET YOUR CAREGIVER KNOW ABOUT:  Allergies to food or medicine.   Medicines taken, including vitamins, herbs, eyedrops, over-the-counter medicines, and creams.   Use of steroids (by mouth or creams).   Previous problems with anesthetics or numbing medicines.   History of bleeding problems or blood clots.   Previous surgery.   Other health problems, including diabetes and kidney problems.   Possibility of pregnancy, if this applies.  RISKS AND COMPLICATIONS All surgery is associated with risks. Some problems that may occur following this procedure include:  Infection.   Damage to the common bile duct, nerves, arteries, veins, or other internal organs such as the stomach or intestines.   Bleeding.   A stone may remain in the common bile duct.  BEFORE THE PROCEDURE  Do not take aspirin for 3 days prior to surgery or blood thinners for 1 week prior to surgery.   Do not eat or  drink anything after midnight the night before surgery.   Let your caregiver know if you develop a cold or other infectious problem prior to surgery.   You should be present 60 minutes before the procedure or as directed.  PROCEDURE  You will be given medicine that makes you sleep (general anesthetic). When you are asleep, your surgeon will make several small cuts (incisions) in your abdomen. One of these incisions is used to insert a small, lighted scope (laparoscope) into the abdomen. The laparoscope helps the surgeon see into your abdomen. Carbon dioxide gas will be pumped into your abdomen. The gas allows more room for the surgeon to perform your surgery. Other operating instruments are inserted through the other incisions. Laparoscopic procedures may not be appropriate when:  There is major scarring from previous surgery.   The gallbladder is extremely inflamed.   There are bleeding disorders or unexpected cirrhosis of the liver.   A pregnancy is near term.   Other conditions make the laparoscopic procedure impossible.  If your surgeon feels it is not safe to continue with a laparoscopic procedure, he or she will perform an open abdominal procedure. In this case, the surgeon will make an incision to open the abdomen. This gives the surgeon a larger view and field to work within. This may allow the surgeon to perform procedures that sometimes cannot be performed with a laparoscope alone. Open surgery has a longer recovery time. AFTER THE PROCEDURE  You will be taken to the recovery area where a nurse will watch and check your progress.   You may be allowed to go home   the same day.   Do not resume physical activities until directed by your caregiver.   You may resume a normal diet and activities as directed.  Document Released: 10/08/2005 Document Revised: 09/27/2011 Document Reviewed: 03/23/2011 Exit[employer] Patient Information 2012 [employer].[email or web address] ______[facility] LAPAROSCOPIC SURGERY: POST OP INSTRUCTIONS Always review your discharge instruction sheet given to you by the facility where your surgery was performed. IF YOU HAVE DISABILITY OR FAMILY LEAVE FORMS, YOU MUST BRING THEM TO THE OFFICE FOR PROCESSING.   DO NOT GIVE THEM TO YOUR DOCTOR.  1. A prescription for pain medication may be given to you upon discharge.  Take your pain medication as prescribed, if needed.  If narcotic pain medicine is not needed, then you may take acetaminophen (Tylenol) or ibuprofen (Advil) as needed. 2. Take your usually prescribed medications unless otherwise directed. 3. If you need a refill on your pain medication, please contact your pharmacy.  They will contact our office to request authorization. Prescriptions will not be filled after 5pm or on week-ends. 4. You should follow a light diet the first few days after arrival home, such as soup and crackers, etc.  Be sure to include lots of fluids daily. 5. Most patients will experience some swelling and bruising in the area of the incisions.  Ice packs will help.  Swelling and bruising can take several days to resolve.  6. It is common to experience some constipation if taking pain medication after surgery.  Increasing fluid intake and taking a stool softener (such as Colace) will usually help or prevent this problem from occurring.  A mild laxative (Milk of Magnesia or Miralax) should be taken according to package instructions if there are no bowel movements after 48 hours. 7. Unless discharge instructions indicate otherwise, you may remove your bandages 24-48 hours after surgery, and you may shower at that time.  You may have steri-strips (small skin tapes) in place directly over the incision.  These strips should be left on the skin for 7-10 days.  If your surgeon used skin glue on the incision, you may shower in 24 hours.  The glue will flake off over the next 2-3 weeks.  Any sutures or staples will be removed at the  office during your follow-up visit. 8. ACTIVITIES:  You may resume regular (light) daily activities beginning the next day--such as daily self-care, walking, climbing stairs--gradually increasing activities as tolerated.  You may have sexual intercourse when it is comfortable.  Refrain from any heavy lifting or straining until approved by your doctor. a. You may drive when you are no longer taking prescription pain medication, you can comfortably wear a seatbelt, and you can safely maneuver your car and apply brakes. b. RETURN TO WORK:  __________________________________________________________ 9. You should see your doctor in the office for a follow-up appointment approximately 2-3 weeks after your surgery.  Make sure that you call for this appointment within a day or two after you arrive home to insure a convenient appointment time. 10. OTHER INSTRUCTIONS: __________________________________________________________________________________________________________________________ __________________________________________________________________________________________________________________________ WHEN TO CALL YOUR DOCTOR: 1. Fever over 101.0 2. Inability to urinate 3. Continued bleeding from incision. 4. Increased pain, redness, or drainage from the incision. 5. Increasing abdominal pain  The clinic staff is available to answer your questions during regular business hours.  Please don't hesitate to call and ask to speak to one of the nurses for clinical concerns.  If you have a medical emergency, go to the nearest emergency room or call 911.    A surgeon from [facility] is always on call at the hospital. [address], [address], [place], [place]  [address] ? [address] [address], [place], [place]   [address] [phone] ? [phone] ? FAX [phone] Web site: [email or web address]  

## 2012-01-29 NOTE — Progress Notes (Signed)
1 Day Post-Op  Subjective: Feels better than pre-op and able to go home. Mild itching possibly from percocet. No nausea, tolerated diet  Objective: Vital signs in last 24 hours: Temp:  [97.8 F (36.6 C)-99.1 F (37.3 C)] 98.4 F (36.9 C) (04/09 0615) Pulse Rate:  [68-100] 89  (04/09 0615) Resp:  [10-20] 18  (04/09 0615) BP: (125-152)/(75-102) 125/83 mmHg (04/09 0615) SpO2:  [94 %-100 %] 97 % (04/09 0615)   Intake/Output from previous day: 04/08 0701 - 04/09 0700 In: 1600 [I.V.:1600] Out: 200 [Urine:200] Intake/Output this shift:     General appearance: alert and no distress GI: Soft and benign, still a little distended, but had lots of air in SB/colon at surgery  Incision: healing well  Lab Results:   [facility] 01/27/12 1917  WBC 13.3*  HGB 14.6  HCT 42.6  PLT 231   BMET  Basename 01/27/12 1917  NA 136  K 3.6  CL 98  CO2 26  GLUCOSE 138*  BUN 15  CREATININE 1.01  CALCIUM 10.0   PT/INR No results found for this basename: LABPROT:2,INR:2 in the last 72 hours ABG No results found for this basename: PHART:2,PCO2:2,PO2:2,HCO3:2 in the last 72 hours  MEDS, Scheduled    . ertapenem [facility]) IV  1 g Intravenous Q24H  . fentaNYL      . heparin  5,000 Units Subcutaneous Q8H  . pantoprazole (PROTONIX) IV  40 mg Intravenous QHS    Studies/Results: Dg Cholangiogram Operative  01/28/2012  *RADIOLOGY REPORT*  Clinical Data:   Cholelithiasis  INTRAOPERATIVE CHOLANGIOGRAM  Technique:  Cholangiographic images from the C-arm fluoroscopic device were submitted for interpretation post-operatively.  Please see the procedural report for the amount of contrast and the fluoroscopy time utilized.  Comparison:  None  Findings:  No persistent filling defects in the common duct. Intrahepatic ducts are incompletely visualized, appearing decompressed centrally. Contrast passes into the duodenum.  IMPRESSION  Negative for retained common duct stone.  Original Report Authenticated By:  Osa Craver, M.D.   US Abdomen Complete  01/27/2012  *RADIOLOGY REPORT*  Clinical Data:  Right upper quadrant pain.  COMPLETE ABDOMINAL ULTRASOUND  Comparison:  Radiographs dated 01/27/2012  Findings:  Gallbladder:  There is a nonshadowing 1 cm echogenic lesion in the gallbladder which probably represents a sludge ball.  No gallbladder wall thickening.  Negative sonographic Murphy's sign. Tiny echogenic lesion along the gallbladder wall with comet tail sign consistent with adenomyosis.  Common bile duct:  Normal.  5.9 mm in diameter.  Liver:  Normal.  IVC:  Normal.  Pancreas:  Normal.  Spleen:  Normal.  9.9 cm in length.  Right Kidney:  Normal.  9.8 cm in length.  Left Kidney:  Normal.  11.4 cm in length.  Abdominal aorta:  Normal.  2.5 cm maximum diameter.  IMPRESSION: Probable small sludge ball in the neck of the gallbladder or gallbladder polyp.  This lesion does not move and does not shadow. Probable slight adenomyosis of the gallbladder.  Otherwise, normal exam.  Original Report Authenticated By: Gwynn Burly, M.D.   Ct Abdomen Pelvis W Contrast  01/28/2012  *RADIOLOGY REPORT*  Clinical Data: Central and epigastric pain for past day.  CT ABDOMEN AND PELVIS WITH CONTRAST  Technique:  Multidetector CT imaging of the abdomen and pelvis was performed following the standard protocol during bolus administration of intravenous contrast.  Contrast: OMNIPAQUE IOHEXOL 300 MG/ML  SOLN  Comparison: Ultrasound 01/27/2012.  Findings: No extraluminal bowel inflammatory process, free fluid  or free air.  Specifically, no inflammation surrounds the appendix. Portions of the proximal small bowel appear slightly thickened which may be related to under distension.  Fatty infiltration of the liver without focal hepatic lesion.  Dilated gallbladder with suggestion of minimal amount of pericholecystic fluid.  This raises possibility cholecystitis.  No focal splenic, pancreatic, renal or adrenal lesion.   Noncontrast filled views of the urinary bladder unremarkable. Degenerative changes L5-S1.  Lung bases clear.  IMPRESSION: Most notable abnormality on the present examination is the suggestion of minimal amount of pericholecystic fluid raising possibility cholecystitis.  This was discussed with Dr. Ranae Palms.  Fatty infiltration liver.  Probable under distended loops of small bowel or peristalsis cause slightly thickened appearance of proximal small bowel loops rather than inflammation.  No CT findings to suggest appendiceal abnormality.  Original Report Authenticated By: Fuller Canada, M.D.   Dg Abd Acute W/chest  01/27/2012  *RADIOLOGY REPORT*  Clinical Data: Abdominal pain.  Nausea. Shortness of breath.  ACUTE ABDOMEN SERIES (ABDOMEN 2 VIEW & CHEST 1 VIEW)  Comparison: None.  Findings: Abnormal bowel gas pattern with gas and fluid filled slightly prominent size small bowel loops.  Question partial small bowel obstruction?  No free intraperitoneal air.  Minimal peribronchial thickening without segmental infiltrate. Heart size within normal limits.  IMPRESSION: Abnormal although nonspecific bowel gas pattern.  Question partial small bowel obstruction?  Original Report Authenticated By: Fuller Canada, M.D.    Assessment: s/p Procedure(s): LAPAROSCOPIC CHOLECYSTECTOMY WITH INTRAOPERATIVE CHOLANGIOGRAM Doing well and able to go home today  Plan: Discharge   LOS: 2 days     Currie Paris, MD, [facility], [place] [phone]   01/29/2012 8:40 AM

## 2012-01-29 NOTE — Discharge Summary (Signed)
Patient ID: David Cordova [id number] 31 y.o. 12/03/1980  01/27/2012  Discharge date and time: 01/29/2012  9:22 AM  Admitting Physician: Currie Paris  Discharge Physician: Currie Paris  Admission Diagnoses: abd pain; chest tightness Acute cholecystitis  Discharge Diagnoses: Acute calculous cholecystitis  Operations: Procedure(s): LAPAROSCOPIC CHOLECYSTECTOMY WITH INTRAOPERATIVE CHOLANGIOGRAM  Admission Condition: good  Discharged Condition: good  Indication for Admission: Acute cholecystitis  Hospital Course: Patient admitted via ED with acute cholecystitis. Started on antibiotics and taken to OR for lap chole and IOC  Consults: None  Significant Diagnostic Studies: radiology: CT scan: Pericholecystic fluid  Treatments: surgery: Lap chole and IOC  Disposition: Home  Patient Instructions:   David Cordova, David Cordova  Home Medication Instructions [id number]   Printed on:01/29/12 1505  Medication Information                    lisdexamfetamine (VYVANSE) 50 MG capsule Take 50 mg by mouth every morning.           Multiple Vitamin (MULITIVITAMIN WITH MINERALS) TABS Take 1 tablet by mouth daily.           acetaminophen (TYLENOL) 160 MG/5ML suspension Take 500 mg by mouth every 4 (four) hours as needed. For pain           amphetamine-dextroamphetamine (ADDERALL) 20 MG tablet Take 20 mg by mouth 4 (four) times daily.           amphetamine-dextroamphetamine (ADDERALL XR) 15 MG 24 hr capsule Take 15 mg by mouth See admin instructions. Take 2 capsules in the morning and 2 capsules at noon           calcium carbonate (TUMS - DOSED IN MG ELEMENTAL CALCIUM) 500 MG chewable tablet Chew 1 tablet by mouth daily.           HYDROmorphone (DILAUDID) 2 MG tablet Take 1 tablet (2 mg total) by mouth every 12 (twelve) hours as needed for pain.             Activity: activity as tolerated and no driving for today and no lifting, driving, or strenuous exercise for two  weeks  Diet: regular diet  Wound Care: keep wound clean and dry  Follow-up:  With Dr Jamey Ripa in 2 weeks.

## 2012-01-30 ENCOUNTER — Telehealth (INDEPENDENT_AMBULATORY_CARE_PROVIDER_SITE_OTHER): Payer: Self-pay | Admitting: General Surgery

## 2012-01-30 ENCOUNTER — Encounter (INDEPENDENT_AMBULATORY_CARE_PROVIDER_SITE_OTHER): Payer: Self-pay | Admitting: General Surgery

## 2012-01-30 NOTE — Telephone Encounter (Signed)
Mother aware note written and at front for pick up.

## 2012-01-30 NOTE — Telephone Encounter (Signed)
Message copied by Liliana Cline on Wed Jan 30, 2012  9:36 AM ------      Message from: Marnette Burgess      Created: Wed Jan 30, 2012  9:06 AM      Contact: Diogo Anne, Mother, [phone]       Pt's mother called to ask for a note for school, he carries more than 15lb, which he was instructed by Dr. Jillyn Hidden not to do.  If he could get just one week off,  she works down stairs so she can come up and pick up the letter when it's ready, please call.Marland KitchenMarland KitchenMarland Kitchen

## 2012-02-04 ENCOUNTER — Encounter (HOSPITAL_COMMUNITY): Payer: Self-pay | Admitting: Surgery

## 2012-02-05 ENCOUNTER — Telehealth (INDEPENDENT_AMBULATORY_CARE_PROVIDER_SITE_OTHER): Payer: Self-pay | Admitting: Surgery

## 2012-02-05 NOTE — Telephone Encounter (Signed)
Appt made but number provided for patient is out of service. Unable to reach patient.

## 2012-02-12 ENCOUNTER — Ambulatory Visit (INDEPENDENT_AMBULATORY_CARE_PROVIDER_SITE_OTHER): Payer: PRIVATE HEALTH INSURANCE | Admitting: Surgery

## 2012-02-12 ENCOUNTER — Encounter (INDEPENDENT_AMBULATORY_CARE_PROVIDER_SITE_OTHER): Payer: Self-pay | Admitting: Surgery

## 2012-02-12 VITALS — BP 132/92 | HR 80 | Temp 97.6°F | Resp 16 | Ht 70.0 in | Wt 177.8 lb

## 2012-02-12 DIAGNOSIS — K810 Acute cholecystitis: Secondary | ICD-10-CM

## 2012-02-12 NOTE — Patient Instructions (Signed)
We will see you again on an as needed basis. Please call the office at [phone] if you have any questions or concerns. Thank you for allowing us to take care of you.  

## 2012-02-12 NOTE — Progress Notes (Signed)
NAME: David Cordova       DOB: January 12, 1981           DATE: 02/12/2012       ZOX:[id number]   CC: Postop laparoscopic cholecystectomy  HPI:  This patient underwent a laparoscopic cholecystectomy with operative cholangiogram on 4/8. He is in for his first postoperative visit. He notes that his incisional pain has resolved. His preoperative symptoms have improved. He is not having problems with nausea, vomiting, diarrhea, fevers, chills, or urinary symptoms. He is tolerating diet. He feels that he is progressing well and nearly back to normal. PE: General: The patient is alert and appears comfortable, NAD.  Abdomen: Soft and benign. The incisions are healing nicely. There are no apparent problems.  Data reviewed: IOC:  Negative Pathology:  Chronic cholecystitis  Impression:  The patient appears to be doing well, with improvement in his symptoms.  Plan:  He may resume full activity and regular diet. He  will followup with [place] on a p.r.n. basis. I did tell him that he may still have some foods that cause indigestion and ask him to call us if there are any questions, problems or concerns.

## 2012-05-15 ENCOUNTER — Telehealth: Payer: Self-pay

## 2012-05-15 NOTE — Telephone Encounter (Signed)
Left message on machine to call back  

## 2012-05-15 NOTE — Telephone Encounter (Addendum)
PT STATES HE WAS SEEN LAST WEEK AND WAS TOLD TO CALL BACK IF NO BETTER AND HE ISN'T PLEASE CALL [phone] I WASN'T ABLE TO TELL WHEN PT WAS LAST SEEN IN SYSTEM EVEN THOUGH HE SAID HE WAS SEEN LAST WEEK

## 2012-05-15 NOTE — Telephone Encounter (Signed)
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

## 2012-05-15 NOTE — Telephone Encounter (Signed)
Spoke with patient and the provider name give was not one of [facility]. Advised patient of no office visit in our facility.

## 2012-05-17 ENCOUNTER — Ambulatory Visit (INDEPENDENT_AMBULATORY_CARE_PROVIDER_SITE_OTHER): Payer: PRIVATE HEALTH INSURANCE | Admitting: Emergency Medicine

## 2012-05-17 ENCOUNTER — Telehealth: Payer: Self-pay

## 2012-05-17 VITALS — BP 128/84 | HR 84 | Temp 98.6°F | Resp 16 | Ht 68.5 in | Wt 180.0 lb

## 2012-05-17 DIAGNOSIS — H60339 Swimmer's ear, unspecified ear: Secondary | ICD-10-CM

## 2012-05-17 DIAGNOSIS — J40 Bronchitis, not specified as acute or chronic: Secondary | ICD-10-CM

## 2012-05-17 DIAGNOSIS — J018 Other acute sinusitis: Secondary | ICD-10-CM

## 2012-05-17 MED ORDER — HYDROCOD POLST-CHLORPHEN POLST 10-8 MG/5ML PO LQCR: 5.0000 mL | Freq: Two times a day (BID) | ORAL | Status: DC | PRN

## 2012-05-17 MED ORDER — PSEUDOEPHEDRINE-GUAIFENESIN ER 60-600 MG PO TB12: 1.0000 | ORAL_TABLET | Freq: Two times a day (BID) | ORAL | Status: AC

## 2012-05-17 MED ORDER — AMOXICILLIN-POT CLAVULANATE 875-125 MG PO TABS: 1.0000 | ORAL_TABLET | Freq: Two times a day (BID) | ORAL | Status: AC

## 2012-05-17 NOTE — Telephone Encounter (Signed)
Patient seen today about 30 min ago. Was asked if he had ear drops and he said yes. Now he's home and he realizes he doesn't have enough. The ear drops he has were prescribed by another Dr and he needs our Dr that he saw today to prescribe him more so we wont have a terrible earache. Uses [facility] on [address]. Best call back # is [phone].

## 2012-05-17 NOTE — Progress Notes (Signed)
   Date:  05/17/2012   Name:  David Cordova   DOB:  [birth year]   MRN:  [id number]  PCP:  No primary provider on file.    Chief Complaint: Sore Throat, Otalgia and Cough   History of Present Illness:  David Cordova is a 31 y.o. very pleasant male patient who presents with the following:  Seen by FMD and put on zpak on Monday for strep.  No improvement  Has sore throat, nasal congestion and post nasal drainage with a purulent discharge and pain and pressure in both ears.  No fever or chills.  Nonproductive cough.    Patient Active Problem List  Diagnosis  (none) - all problems resolved or deleted    Past Medical History  Diagnosis Date  . ADD (attention deficit disorder)   . Acute cholecystitis 01/28/2012    Presented with acute cholecystitis, underwent Lap chole, IOC on 01/28/12     Past Surgical History  Procedure Date  . Cholecystectomy 01/28/2012    Procedure: LAPAROSCOPIC CHOLECYSTECTOMY WITH INTRAOPERATIVE CHOLANGIOGRAM;  Surgeon: Currie Paris, MD;  Location: [facility];  Service: General;  Laterality: N/A;    History  Substance Use Topics  . Smoking status: Former Smoker    Quit date: 10/23/2003  . Smokeless tobacco: Never Used  . Alcohol Use: Yes     occasional    No family history on file.  No Known Allergies  Medication list has been reviewed and updated.  Current Outpatient Prescriptions on File Prior to Visit  Medication Sig Dispense Refill  . lisdexamfetamine (VYVANSE) 50 MG capsule Take 50 mg by mouth every morning.      . Multiple Vitamin (MULITIVITAMIN WITH MINERALS) TABS Take 1 tablet by mouth daily.        Review of Systems:  As per HPI, otherwise negative.    Physical Examination: Filed Vitals:   05/17/12 1032  BP: 128/84  Pulse: 84  Temp: 98.6 F (37 C)  Resp: 16   Filed Vitals:   05/17/12 1032  Height: 5' 8.5" (1.74 m)  Weight: 180 lb (81.647 kg)   Body mass index is 26.97 kg/(m^2). Ideal Body Weight: Weight in (lb) to  have BMI = 25: 166.5    GEN: WDWN, NAD, Non-toxic, Alert & Oriented x 3 HEENT: Atraumatic, Normocephalic.  Ears and Nose: No external deformity.  Right external otitis.  Nasal congestion EXTR: No clubbing/cyanosis/edema NEURO: Normal gait.  PSYCH: Normally interactive. Conversant. Not depressed or anxious appearing.  Calm demeanor.  Chest persistent cough.  CTA BS=  Assessment and Plan: Sinusitis Otitis externa (right) Bronchitis augmentin Continue drops mucinex d tussionex Follow up with FMD  Carmelina Dane, MD

## 2012-05-18 MED ORDER — OFLOXACIN 0.3 % OT SOLN: 5.0000 [drp] | Freq: Every day | OTIC | Status: AC

## 2012-05-18 NOTE — Telephone Encounter (Signed)
Sent to pharmacy 

## 2012-05-18 NOTE — Telephone Encounter (Signed)
PT NOTIFIED THAT RX WAS SENT IN

## 2012-12-22 IMAGING — CT CT ABD-PELV W/ CM
2 of 4 series · 17 of 46 positions shown, 19 images · IV contrast ([ID] OMNI [id number])
Comparison: Ultrasound 01/27/2012.

CLINICAL DATA: Central and epigastric pain for past day.

CT ABDOMEN AND PELVIS WITH CONTRAST
TECHNIQUE: Multidetector CT imaging of the abdomen and pelvis was
performed following the standard protocol during bolus
administration of intravenous contrast.
Contrast: 100mL OMNIPAQUE IOHEXOL 300 MG/ML  SOLN

[Series 2: rtn ap with st · axial · 0.70mm/px · z∈[-49,+351]mm · 14 of 88 slices shown, 16 images]
[im 4/88  soft-tissue]
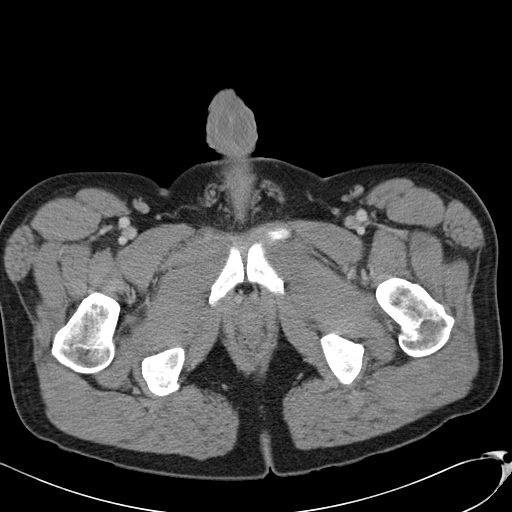
[im 4/88  bone]
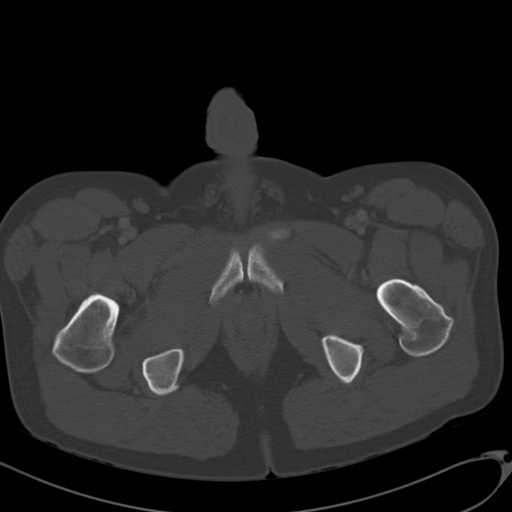
[im 11/88  soft-tissue]
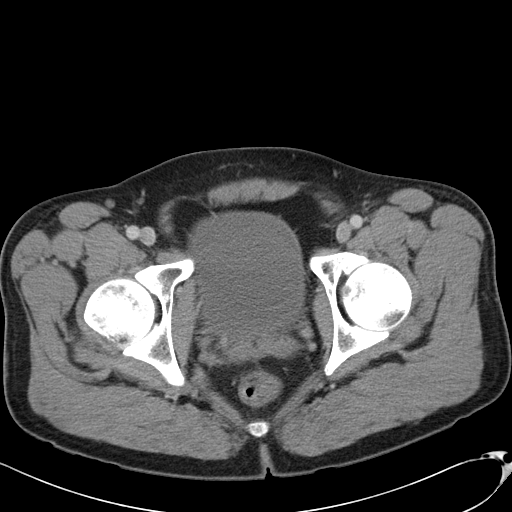
[im 17/88  soft-tissue]
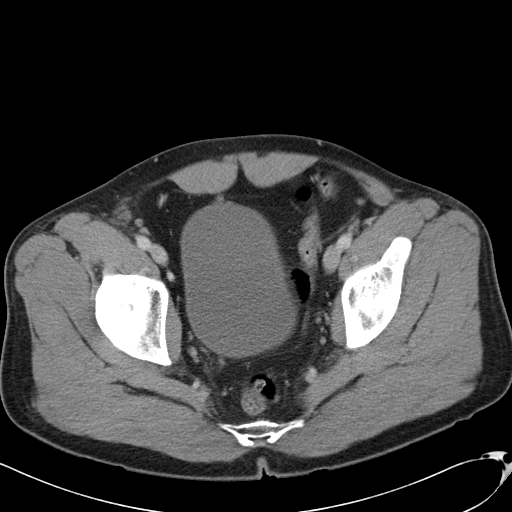
[im 24/88  soft-tissue]
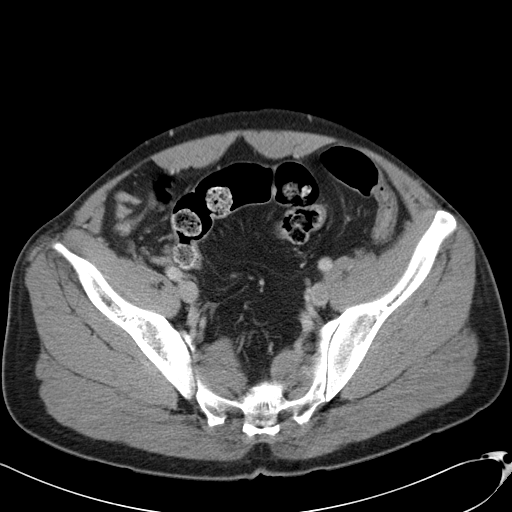
[im 31/88  soft-tissue]
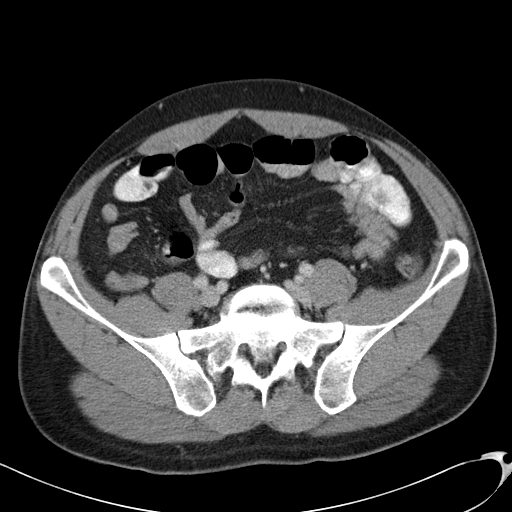
[im 34/88  soft-tissue]
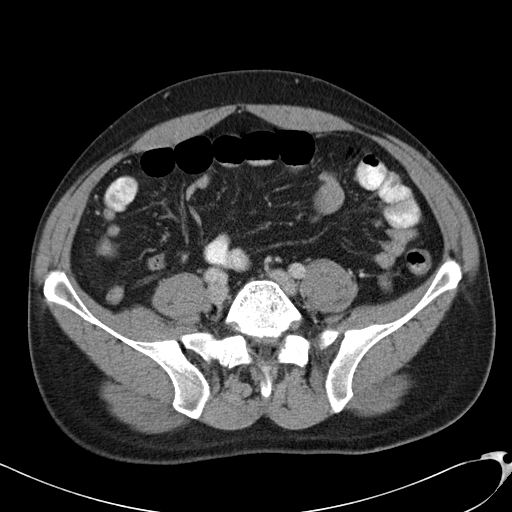
[im 41/88  soft-tissue]
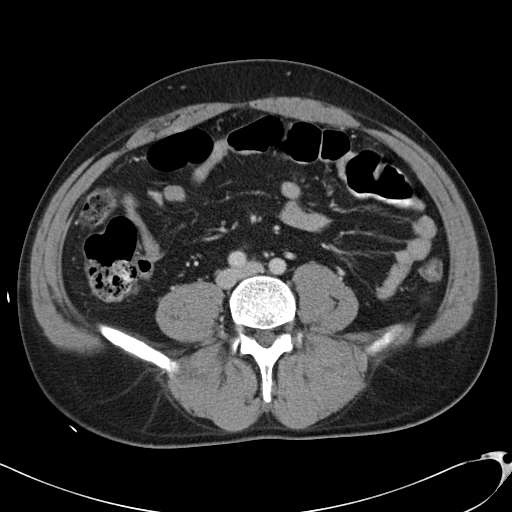
[im 47/88  soft-tissue]
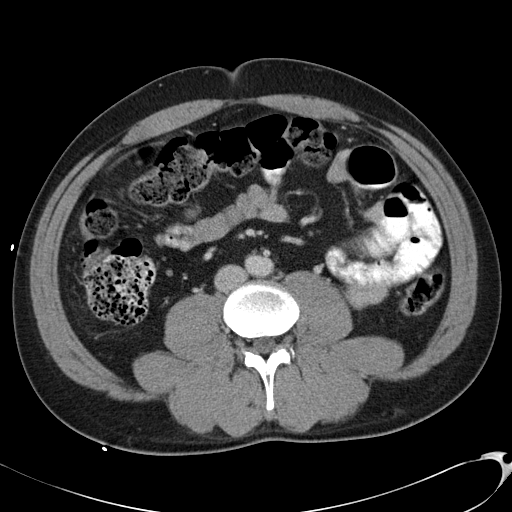
[im 54/88  soft-tissue]
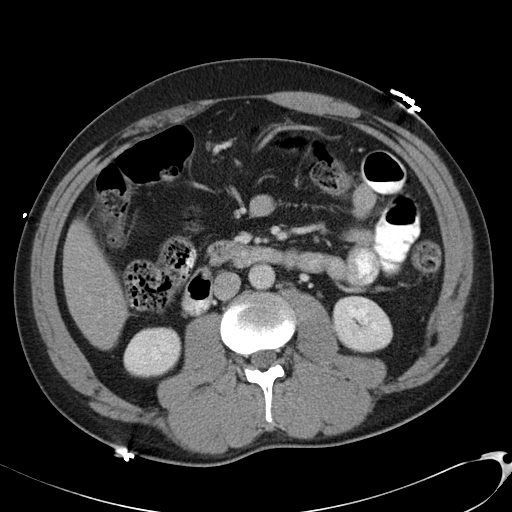
[im 54/88  bone]
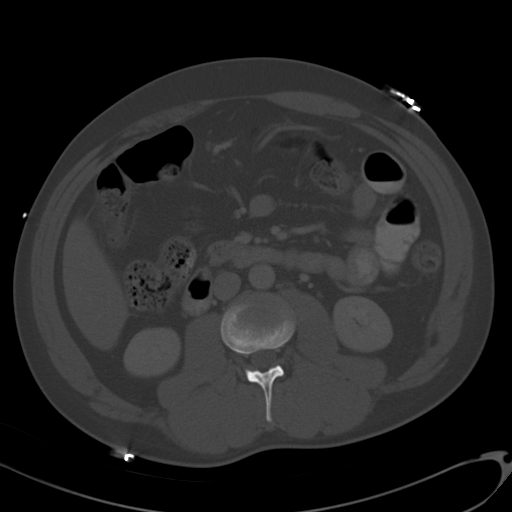
[im 57/88  soft-tissue]
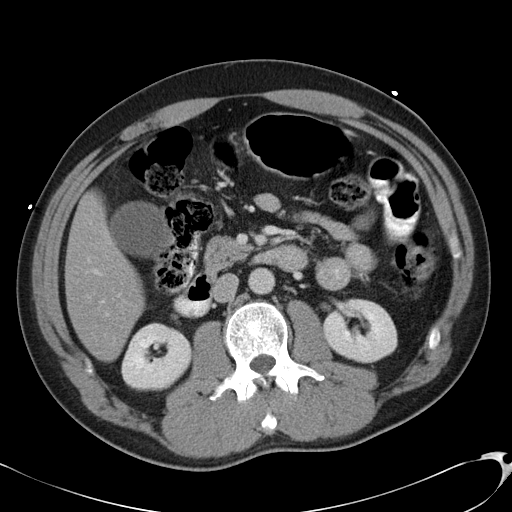
[im 64/88  soft-tissue]
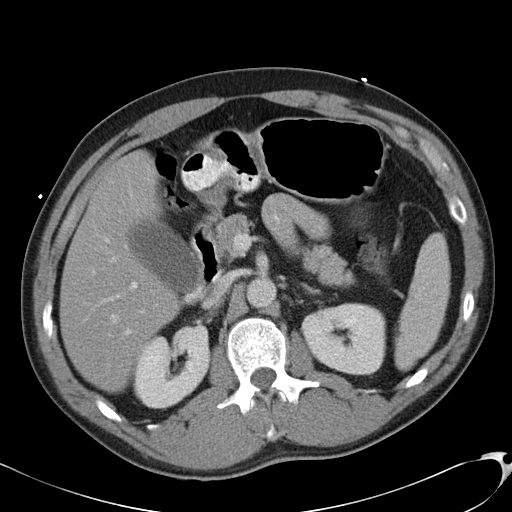
[im 71/88  soft-tissue]
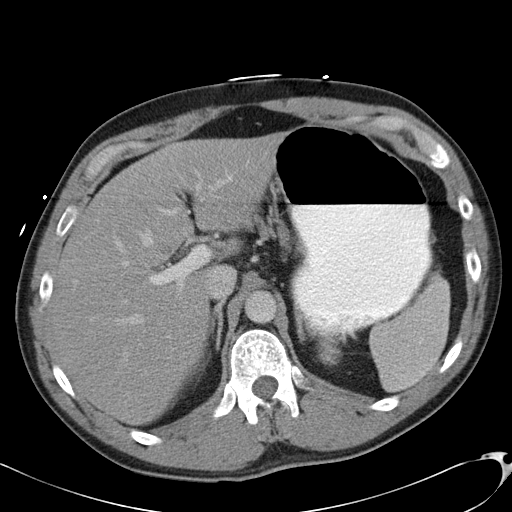
[im 77/88  soft-tissue]
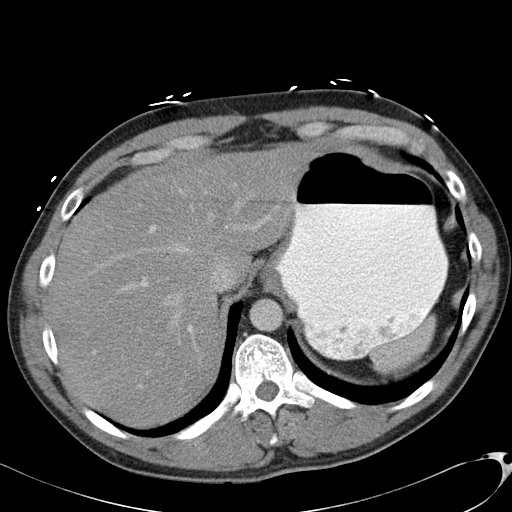
[im 84/88  soft-tissue]
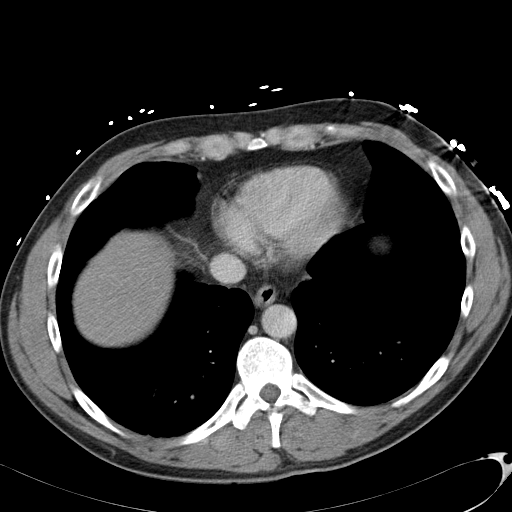

[Series 602: <mpr thick range> · coronal · 0.90mm/px · 3 of 86 slices shown]
[im 29/86  soft-tissue]
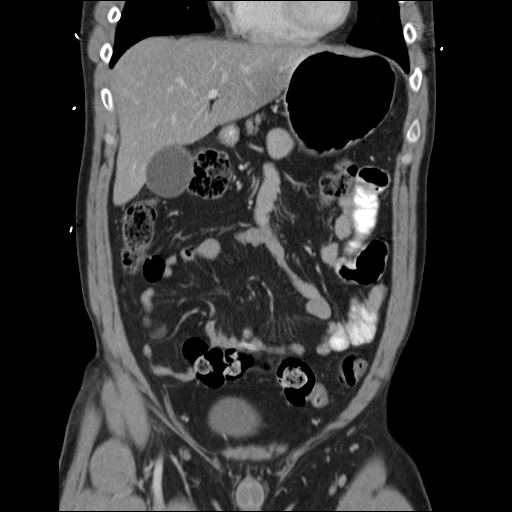
[im 38/86  soft-tissue]
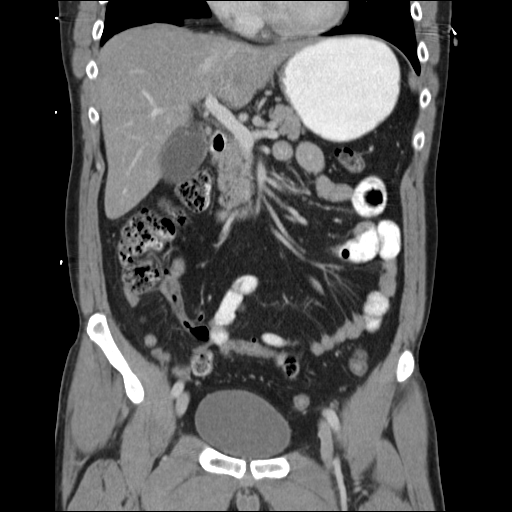
[im 48/86  soft-tissue]
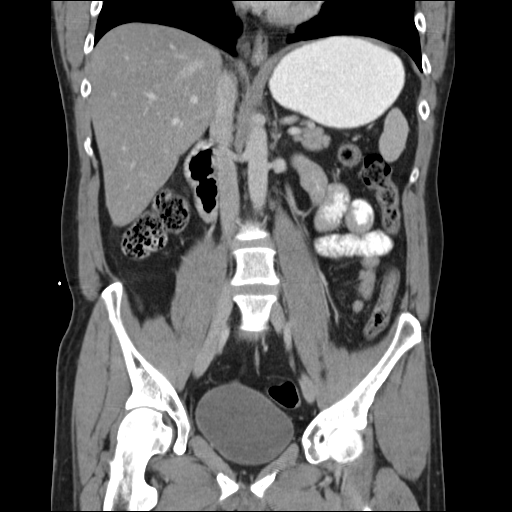

[17 of 46 positions shown; findings below may reference images not displayed]

FINDINGS: No extraluminal bowel inflammatory process, free fluid or
free air.  Specifically, no inflammation surrounds the appendix.
Portions of the proximal small bowel appear slightly thickened
which may be related to under distension.

Fatty infiltration of the liver without focal hepatic lesion.

Dilated gallbladder with suggestion of minimal amount of
pericholecystic fluid.  This raises possibility cholecystitis.

No focal splenic, pancreatic, renal or adrenal lesion.

Noncontrast filled views of the urinary bladder unremarkable.
Degenerative changes L5-S1.  Lung bases clear.
IMPRESSION: Most notable abnormality on the present examination is the
suggestion of minimal amount of pericholecystic fluid raising
possibility cholecystitis.  This was discussed with Dr. Jens Holst.

Fatty infiltration liver.

Probable under distended loops of small bowel or peristalsis cause
slightly thickened appearance of proximal small bowel loops rather
than inflammation.

No CT findings to suggest appendiceal abnormality.

## 2016-11-16 ENCOUNTER — Other Ambulatory Visit: Payer: Self-pay

## 2017-06-15 ENCOUNTER — Encounter: Payer: Self-pay | Admitting: Physician Assistant

## 2017-06-15 ENCOUNTER — Ambulatory Visit (INDEPENDENT_AMBULATORY_CARE_PROVIDER_SITE_OTHER): Payer: BLUE CROSS/BLUE SHIELD | Admitting: Physician Assistant

## 2017-06-15 VITALS — BP 170/100 | HR 109 | Temp 98.5°F | Resp 16 | Ht 68.0 in | Wt 200.6 lb

## 2017-06-15 DIAGNOSIS — H1131 Conjunctival hemorrhage, right eye: Secondary | ICD-10-CM

## 2017-06-15 DIAGNOSIS — R000 Tachycardia, unspecified: Secondary | ICD-10-CM | POA: Diagnosis not present

## 2017-06-15 DIAGNOSIS — R030 Elevated blood-pressure reading, without diagnosis of hypertension: Secondary | ICD-10-CM

## 2017-06-15 DIAGNOSIS — H109 Unspecified conjunctivitis: Secondary | ICD-10-CM

## 2017-06-15 MED ORDER — ERYTHROMYCIN 5 MG/GM OP OINT: 1.0000 "application " | TOPICAL_OINTMENT | Freq: Four times a day (QID) | OPHTHALMIC | 0 refills | Status: AC

## 2017-06-15 NOTE — Patient Instructions (Addendum)
In terms of eye, I would like you to use the erythromycin eye ointment up to 6 x a day for the next week. This should help with the eye matting. Use warm/cold compress to affected area for relief. Try to avoid rubbing the eye. Return on Monday for recheck and for high blood pressure check.    In terms of elevated blood pressure, I would like you to check your blood pressure at least a couple times over the next few days outside of the office and document these values. It is best if you check the blood pressure at different times in the day. Your goal is <140/90. Plan to follow up in office in 2-3 days with these values. If you start to have chest pain, blurred vision, shortness of breath, severe headache, lower leg swelling, or nausea/vomiting please seek care immediately here or at the ED.    Bacterial Conjunctivitis Bacterial conjunctivitis is an infection of your conjunctiva. This is the clear membrane that covers the white part of your eye and the inner surface of your eyelid. This condition can make your eye:  Red or pink.  Itchy.  This condition is caused by bacteria. This condition spreads very easily from person to person (is contagious) and from one eye to the other eye. Follow these instructions at home: Medicines  Take or apply your antibiotic medicine as told by your doctor. Do not stop taking or applying the antibiotic even if you start to feel better.  Take or apply over-the-counter and prescription medicines only as told by your doctor.  Do not touch your eyelid with the eye drop bottle or the ointment tube. Managing discomfort  Wipe any fluid from your eye with a warm, wet washcloth or a cotton ball.  Place a cool, clean washcloth on your eye. Do this for 10-20 minutes, 3-4 times per day. General instructions  Do not wear contact lenses until the irritation is gone. Wear glasses until your doctor says it is okay to wear contacts.  Do not wear eye makeup until your  symptoms are gone. Throw away any old makeup.  Change or wash your pillowcase every day.  Do not share towels or washcloths with anyone.  Wash your hands often with soap and water. Use paper towels to dry your hands.  Do not touch or rub your eyes.  Do not drive or use heavy machinery if your vision is blurry. Contact a doctor if:  You have a fever.  Your symptoms do not get better after 10 days. Get help right away if:  You have a fever and your symptoms suddenly get worse.  You have very bad pain when you move your eye.  Your face: ? Hurts. ? Is red. ? Is swollen.  You have sudden loss of vision. This information is not intended to replace advice given to you by your health care provider. Make sure you discuss any questions you have with your health care provider. Document Released: 07/17/2008 Document Revised: 03/15/2016 Document Reviewed: 07/21/2015 Elsevier Interactive Patient Education  2018 [employer].   IF you received an x-ray today, you will receive an invoice from [facility]. Please contact [facility] at [phone] with questions or concerns regarding your invoice.   IF you received labwork today, you will receive an invoice from [facility]. Please contact [facility] at [phone] with questions or concerns regarding your invoice.   Our billing staff will not be able to assist you with questions regarding bills from these companies.  You will be contacted with the lab results as soon as they are available. The fastest way to get your results is to activate your My Chart account. Instructions are located on the last page of this paperwork. If you have not heard from [place] regarding the results in 2 weeks, please contact this office.

## 2017-06-15 NOTE — Progress Notes (Addendum)
David Cordova  MRN: [id number] DOB: [birth year]  Subjective:  David Cordova is a 36 y.o. male seen in office today for a chief complaint of right eye discomfort 2 days. Notes 2 days ago, he woke up and walked out on his porch when it was dark and what felt like a gnat hit him in the right eye. He quickly swatted it away. Later that day, he noticed some mild itching in the right eye. He then woke up the next morning with his eyes matted shut. He has excessively been rubbing the eye since the initial event. Denies eye pain, foreign body sensation, photophobia, decreased vision, blurred vision, and double vision. Denies use of contact lens. Denies illicit drug use. Denies excessive sneezing and coughing.Has tried visine with no releif.   In terms of elevated blood pressure, he has been told in the past doctor's office that his blood pressure runs high. He does not check it regularly outside the office. Has never been on medication. Denies chest pain, heart palpitations shortness of breath, nausea, vomiting, headache, lower leg swelling, hematuria, and dizziness. He notes he has been very stressed lately. He is also anxious about the eye issue.  Review of Systems Per HPI There are no active problems to display for this patient.   Current Outpatient Prescriptions on File Prior to Visit  Medication Sig Dispense Refill  . Multiple Vitamin (MULITIVITAMIN WITH MINERALS) TABS Take 1 tablet by mouth daily.     No current facility-administered medications on file prior to visit.     No Known Allergies   Objective:  BP (!) 170/100 (BP Location: Left Arm, Patient Position: Sitting, Cuff Size: Large)   Pulse (!) 109   Temp 98.5 F (36.9 C) (Oral)   Resp 16   Ht 5\' 8"  (1.727 m)   Wt 200 lb 9.6 oz (91 kg)   SpO2 97%   BMI 30.50 kg/m   Visual Acuity Screening   Right eye Left eye Both eyes  Without correction: 20/25 20/25 20/25   With correction:       Physical Exam  Constitutional: He  is oriented to person, place, and time and well-developed, well-nourished, and in no distress.  HENT:  Head: Normocephalic and atraumatic.  Eyes: Right eye visual fields normal. Pupils are equal, round, and reactive to light. EOM are normal. Right eye exhibits discharge (watery). Left eye exhibits no discharge. Right conjunctiva is injected (mild). Right conjunctiva has a hemorrhage. Left conjunctiva is injected (mild). Left conjunctiva has no hemorrhage.  Fundoscopic exam:      The right eye shows no AV nicking, no hemorrhage and no papilledema. The right eye shows red reflex.       The left eye shows no AV nicking, no hemorrhage and no papilledema. The left eye shows red reflex.  Slit lamp exam:      The right eye shows no foreign body, no fluorescein uptake and no anterior chamber bulge.    No pain with palpation of bilateral orbits.  Neck: Normal range of motion.  Cardiovascular: Regular rhythm and normal heart sounds.  Tachycardia present.   Pulmonary/Chest: Effort normal and breath sounds normal.  Neurological: He is alert and oriented to person, place, and time. Gait normal.  Skin: Skin is warm and dry.  Psychiatric: Affect normal. His mood appears anxious.  Vitals reviewed.   Assessment and Plan :  1. Subconjunctival hemorrhage of right eye Physical exam findings consistent with subconjunctival hemorrhage, likely due to mild trauma  from contact with bug and excessive rubbing of eye. History suggestive of underlying bacterial conjunctivitis. Educated patient that the subconjunctival hemorrhage is self-limiting. He may use warm/cold compress to affected area 4-5 times a day for 20 minutes at a time. Will also treat empirically for underlying bacterial conjunctivitis with erythromycin ointment. Plan to follow-up next week for reevaluation. Encouraged to return sooner if symptoms worsen or if he develops new concerning symptoms 2. Bacterial conjunctivitis - erythromycin ophthalmic  ointment; Place 1 application into the right eye 4 (four) times daily.  Dispense: 3.5 g; Refill: 0  3. Elevated blood pressure reading 4. Tachycardia Asymptomatic. Patient does appear anxious. Instructed to check bp outside of office over the next couple of days. Plan to return next week for repeat blood pressure in office. If still elevated at this time, consider starting medication for HTN. Given strict ED precautions.   Benjiman Core PA-C  Primary [facility] at [facility] 06/15/2017 5:10 PM

## 2020-04-10 ENCOUNTER — Encounter: Payer: Self-pay | Admitting: Emergency Medicine

## 2020-04-10 ENCOUNTER — Other Ambulatory Visit: Payer: Self-pay

## 2020-04-10 ENCOUNTER — Ambulatory Visit
Admission: EM | Admit: 2020-04-10 | Discharge: 2020-04-10 | Disposition: A | Discharge status: Home / Self Care | Payer: 59 | Attending: Emergency Medicine | Admitting: Emergency Medicine

## 2020-04-10 ENCOUNTER — Ambulatory Visit (INDEPENDENT_AMBULATORY_CARE_PROVIDER_SITE_OTHER): Payer: 59

## 2020-04-10 DIAGNOSIS — R05 Cough: Secondary | ICD-10-CM

## 2020-04-10 DIAGNOSIS — R059 Cough, unspecified: Secondary | ICD-10-CM

## 2020-04-10 DIAGNOSIS — R030 Elevated blood-pressure reading, without diagnosis of hypertension: Secondary | ICD-10-CM

## 2020-04-10 DIAGNOSIS — R6883 Chills (without fever): Secondary | ICD-10-CM

## 2020-04-10 MED ORDER — BENZONATATE 100 MG PO CAPS: 100.0000 mg | ORAL_CAPSULE | Freq: Three times a day (TID) | ORAL | 0 refills | Status: DC

## 2020-04-10 MED ORDER — PREDNISONE 50 MG PO TABS: ORAL_TABLET | ORAL | 0 refills | Status: DC

## 2020-04-10 MED ORDER — ALBUTEROL SULFATE HFA 108 (90 BASE) MCG/ACT IN AERS: 2.0000 | INHALATION_SPRAY | RESPIRATORY_TRACT | 0 refills | Status: AC | PRN

## 2020-04-10 MED ORDER — AEROCHAMBER PLUS FLO-VU MEDIUM MISC: 1.0000 | Freq: Once | 0 refills | Status: AC

## 2020-04-10 MED ORDER — AMLODIPINE BESYLATE 5 MG PO TABS: 5.0000 mg | ORAL_TABLET | Freq: Every day | ORAL | 0 refills | Status: AC

## 2020-04-10 NOTE — ED Provider Notes (Signed)
[name]    CSN: [id number] Arrival date & time: 04/10/20  1323      History   Chief Complaint Chief Complaint  Patient presents with  . Cough  . Chills    HPI David Cordova is a 39 y.o. male with history of seasonal allergies presenting for cough, chills, malaise x3 days.  Patient does note some pain with inspiration, though this is not consistent or exertional.  Patient denying chest pain, palpitations, shortness of breath, lower extremity edema.  No fever, arthralgias, myalgias.  States his girlfriend was sick, though has undergone negative Covid testing.  Patient has not undergone Covid testing himself.  Requesting work note.   Past Medical History:  Diagnosis Date  . Acute cholecystitis 01/28/2012   Presented with acute cholecystitis, underwent Lap chole, IOC on 01/28/12   . ADD (attention deficit disorder)   . Seasonal allergies     There are no problems to display for this patient.   Past Surgical History:  Procedure Laterality Date  . CHOLECYSTECTOMY  01/28/2012   Procedure: LAPAROSCOPIC CHOLECYSTECTOMY WITH INTRAOPERATIVE CHOLANGIOGRAM;  Surgeon: Currie Paris, MD;  Location: [facility];  Service: General;  Laterality: N/A;       Home Medications    Prior to Admission medications   Medication Sig Start Date End Date Taking? Authorizing Provider  albuterol (VENTOLIN HFA) 108 (90 Base) MCG/ACT inhaler Inhale 2 puffs into the lungs every 4 (four) hours as needed for wheezing or shortness of breath. 04/10/20   Hall-Potvin, [place], PA-C  amLODipine (NORVASC) 5 MG tablet Take 1 tablet (5 mg total) by mouth daily. 04/10/20   Hall-Potvin, [place], PA-C  benzonatate (TESSALON) 100 MG capsule Take 1 capsule (100 mg total) by mouth every 8 (eight) hours. 04/10/20   Hall-Potvin, [place], PA-C  Multiple Vitamin (MULITIVITAMIN WITH MINERALS) TABS Take 1 tablet by mouth daily.    [provider]  predniSONE (DELTASONE) 50 MG tablet Take 1 tab by  mouth every morning with breakfast 04/10/20   Hall-Potvin, [place], PA-C  Spacer/Aero-Holding Chambers (AEROCHAMBER PLUS FLO-VU MEDIUM) MISC 1 each by Other route once for 1 dose. 04/10/20 04/10/20  Hall-Potvin, [place], PA-C  VYVANSE 70 MG capsule  06/10/17   [provider]    Family History History reviewed. No pertinent family history.  Social History Social History   Tobacco Use  . Smoking status: Former Smoker    Quit date: 10/23/2003    Years since quitting: 16.4  . Smokeless tobacco: Never Used  Vaping Use  . Vaping Use: Never used  Substance Use Topics  . Alcohol use: Yes    Comment: occasional  . Drug use: No     Allergies   Patient has no known allergies.   Review of Systems As per HPI   Physical Exam Triage Vital Signs ED Triage Vitals  Enc Vitals Group     BP      Pulse      Resp      Temp      Temp src      SpO2      Weight      Height      Head Circumference      Peak Flow      Pain Score      Pain Loc      Pain Edu?      Excl. in GC?    No data found.  Updated Vital Signs BP (!) 171/115 (BP Location: Left Arm)  Pulse 75   Temp 98.6 F (37 C) (Oral)   Resp 18   SpO2 98%   Visual Acuity Right Eye Distance:   Left Eye Distance:   Bilateral Distance:    Right Eye Near:   Left Eye Near:    Bilateral Near:     Physical Exam Constitutional:      General: He is not in acute distress.    Appearance: He is not toxic-appearing or diaphoretic.  HENT:     Head: Normocephalic and atraumatic.     Mouth/Throat:     Mouth: Mucous membranes are moist.     Pharynx: Oropharynx is clear.  Eyes:     General: No scleral icterus.    Conjunctiva/sclera: Conjunctivae normal.     Pupils: Pupils are equal, round, and reactive to light.  Neck:     Comments: Trachea midline, negative JVD Cardiovascular:     Rate and Rhythm: Normal rate and regular rhythm.  Pulmonary:     Effort: Pulmonary effort is normal. No respiratory distress.      Breath sounds: Rhonchi present. No wheezing.     Comments: Diffuse, mild, < RUL Musculoskeletal:     Cervical back: Neck supple. No tenderness.  Lymphadenopathy:     Cervical: No cervical adenopathy.  Skin:    Capillary Refill: Capillary refill takes less than 2 seconds.     Coloration: Skin is not jaundiced or pale.     Findings: No rash.  Neurological:     Mental Status: He is alert and oriented to person, place, and time.      UC Treatments / Results  Labs (all labs ordered are listed, but only abnormal results are displayed) Labs Reviewed  NOVEL CORONAVIRUS, NAA    EKG   Radiology DG Chest 2 View  Result Date: 04/10/2020 CLINICAL DATA:  Cough.  Chills. EXAM: CHEST - 2 VIEW COMPARISON:  None. FINDINGS: The heart size and mediastinal contours are within normal limits. Both lungs are clear. The visualized skeletal structures are unremarkable. IMPRESSION: No active cardiopulmonary disease. Electronically Signed   By: Gerome Sam III M.D   On: 04/10/2020 14:07    Procedures Procedures (including critical care time)  Medications Ordered in UC Medications - No data to display  Initial Impression / Assessment and Plan / UC Course  I have reviewed the triage vital signs and the nursing notes.  Pertinent labs & imaging results that were available during my care of the patient were reviewed by me and considered in my medical decision making (see chart for details).     Patient afebrile, nontoxic, with SpO2 98%.  Chest x-ray done office, reviewed by me radiology: Negative for acute pathology.  Patient declined EKG.  Discussed importance of follow-up with PCP for hypertensive management.  Will start amlodipine in the interim.  Covid PCR pending.  Patient to quarantine until results are back.  We will treat supportively as outlined below.  Return precautions discussed, patient verbalized understanding and is agreeable to plan. Final Clinical Impressions(s) / UC Diagnoses    Final diagnoses:  Cough  Elevated blood-pressure reading, without diagnosis of hypertension     Discharge Instructions     Number to take blood pressure medication every night before bed. May take cough medication up to 3 times daily as needed for cough. Prednisone once daily with breakfast: May take lunchtime today. Use albuterol inhaler with your spacing device to help with any chest tightness or difficulty breathing. Very important to follow-up with PCP  for further evaluation and management of your elevated blood pressure as this can lead to significant health issues in the future. Go to ER for worsening cough, difficulty breathing, chest pain, lightheadedness, vomiting.    ED Prescriptions    Medication Sig Dispense Auth. Provider   amLODipine (NORVASC) 5 MG tablet Take 1 tablet (5 mg total) by mouth daily. 30 tablet Hall-Potvin, [place], PA-C   predniSONE (DELTASONE) 50 MG tablet Take 1 tab by mouth every morning with breakfast 5 tablet Hall-Potvin, [place], PA-C   benzonatate (TESSALON) 100 MG capsule Take 1 capsule (100 mg total) by mouth every 8 (eight) hours. 21 capsule Hall-Potvin, [place], PA-C   albuterol (VENTOLIN HFA) 108 (90 Base) MCG/ACT inhaler Inhale 2 puffs into the lungs every 4 (four) hours as needed for wheezing or shortness of breath. 18 g Hall-Potvin, [place], PA-C   Spacer/Aero-Holding Chambers (AEROCHAMBER PLUS FLO-VU MEDIUM) MISC 1 each by Other route once for 1 dose. 1 each Hall-Potvin, [place], PA-C     PDMP not reviewed this encounter.   Hall-Potvin, [place], [place] 04/10/20 1556

## 2020-04-10 NOTE — Discharge Instructions (Addendum)
Number to take blood pressure medication every night before bed. May take cough medication up to 3 times daily as needed for cough. Prednisone once daily with breakfast: May take lunchtime today. Use albuterol inhaler with your spacing device to help with any chest tightness or difficulty breathing. Very important to follow-up with PCP for further evaluation and management of your elevated blood pressure as this can lead to significant health issues in the future. Go to ER for worsening cough, difficulty breathing, chest pain, lightheadedness, vomiting.

## 2020-04-10 NOTE — ED Triage Notes (Signed)
Pt here for chills and not feeling well x 3 days; pt sts some pain with inspiration

## 2020-04-11 LAB — NOVEL CORONAVIRUS, NAA: SARS-CoV-2, NAA: NOT DETECTED

## 2020-04-11 LAB — SARS-COV-2, NAA 2 DAY TAT

## 2020-06-23 ENCOUNTER — Other Ambulatory Visit: Payer: Self-pay

## 2020-06-23 ENCOUNTER — Ambulatory Visit
Admission: EM | Admit: 2020-06-23 | Discharge: 2020-06-23 | Disposition: A | Discharge status: Home / Self Care | Payer: 59 | Attending: Emergency Medicine | Admitting: Emergency Medicine

## 2020-06-23 DIAGNOSIS — L03213 Periorbital cellulitis: Secondary | ICD-10-CM

## 2020-06-23 DIAGNOSIS — Z23 Encounter for immunization: Secondary | ICD-10-CM

## 2020-06-23 MED ORDER — AMOXICILLIN-POT CLAVULANATE 875-125 MG PO TABS
1.0000 | ORAL_TABLET | Freq: Two times a day (BID) | ORAL | 0 refills | Status: DC
Start: 2020-06-23 — End: 2020-07-31

## 2020-06-23 MED ORDER — TETANUS-DIPHTH-ACELL PERTUSSIS 5-2.5-18.5 LF-MCG/0.5 IM SUSP
0.5000 mL | Freq: Once | INTRAMUSCULAR | Status: AC
  Administered 2020-06-23: 0.5 mL via INTRAMUSCULAR

## 2020-06-23 NOTE — ED Triage Notes (Signed)
Pt states was scratched by his dog almost 2wks ago under rt eye. States now having swelling, drainage, and redness under rt eye.

## 2020-06-23 NOTE — Discharge Instructions (Signed)
Keep area(s) clean and dry. °Apply hot compress / towel for 5-10 minutes 3-5 times daily. °Take antibiotic as prescribed with food - important to complete course. °Return for worsening pain, redness, swelling, discharge, fever. ° °Helpful prevention tips: °Keep nails short to avoid secondary skin infections. °Use new, clean razors when shaving. °Avoid antiperspirants - look for deodorants without aluminum. °Avoid wearing underwire bras as this can irritate the area further.  °

## 2020-06-23 NOTE — ED Provider Notes (Signed)
[name]    CSN: [id number] Arrival date & time: 06/23/20  0845      History   Chief Complaint Chief Complaint  Patient presents with  . Facial Swelling    HPI David Cordova is a 39 y.o. male presenting for right lower eyelid pain and swelling.  States he was scratched by his dog over a week and a half ago.  Did not notice much pain and swelling until this past week.  No visual changes, orbital range of motion pain, fever, discharge.  Has used hot compresses with some relief.   Past Medical History:  Diagnosis Date  . Acute cholecystitis 01/28/2012   Presented with acute cholecystitis, underwent Lap chole, IOC on 01/28/12   . ADD (attention deficit disorder)   . Seasonal allergies     There are no problems to display for this patient.   Past Surgical History:  Procedure Laterality Date  . CHOLECYSTECTOMY  01/28/2012   Procedure: LAPAROSCOPIC CHOLECYSTECTOMY WITH INTRAOPERATIVE CHOLANGIOGRAM;  Surgeon: Currie Paris, MD;  Location: [facility];  Service: General;  Laterality: N/A;       Home Medications    Prior to Admission medications   Medication Sig Start Date End Date Taking? Authorizing Provider  albuterol (VENTOLIN HFA) 108 (90 Base) MCG/ACT inhaler Inhale 2 puffs into the lungs every 4 (four) hours as needed for wheezing or shortness of breath. 04/10/20   Hall-Potvin, [place], PA-C  amLODipine (NORVASC) 5 MG tablet Take 1 tablet (5 mg total) by mouth daily. 04/10/20   Hall-Potvin, [place], PA-C  amoxicillin-clavulanate (AUGMENTIN) 875-125 MG tablet Take 1 tablet by mouth every 12 (twelve) hours. 06/23/20   Hall-Potvin, [place], PA-C  Multiple Vitamin (MULITIVITAMIN WITH MINERALS) TABS Take 1 tablet by mouth daily.    [provider]  VYVANSE 70 MG capsule  06/10/17   [provider]    Family History History reviewed. No pertinent family history.  Social History Social History   Tobacco Use  . Smoking status: Former Smoker      Quit date: 10/23/2003    Years since quitting: 16.6  . Smokeless tobacco: Never Used  Vaping Use  . Vaping Use: Never used  Substance Use Topics  . Alcohol use: Yes    Comment: occasional  . Drug use: No     Allergies   Patient has no known allergies.   Review of Systems As per HPI   Physical Exam Triage Vital Signs ED Triage Vitals [06/23/20 0945]  Enc Vitals Group     BP (!) 150/102     Pulse Rate 95     Resp 18     Temp 98.1 F (36.7 C)     Temp Source Oral     SpO2 97 %     Weight      Height      Head Circumference      Peak Flow      Pain Score 2     Pain Loc      Pain Edu?      Excl. in GC?    No data found.  Updated Vital Signs BP (!) 150/102 (BP Location: Left Arm)   Pulse 95   Temp 98.1 F (36.7 C) (Oral)   Resp 18   SpO2 97%   Visual Acuity Right Eye Distance:   Left Eye Distance:   Bilateral Distance:    Right Eye Near:   Left Eye Near:    Bilateral Near:  Physical Exam Constitutional:      General: He is not in acute distress. HENT:     Head: Normocephalic and atraumatic.  Eyes:     General: No scleral icterus.       Right eye: No discharge.        Left eye: No discharge.     Extraocular Movements: Extraocular movements intact.     Conjunctiva/sclera: Conjunctivae normal.     Comments: Vision grossly intact bilaterally  Cardiovascular:     Rate and Rhythm: Normal rate.  Pulmonary:     Effort: Pulmonary effort is normal. No respiratory distress.     Breath sounds: No wheezing.  Skin:    Coloration: Skin is not jaundiced or pale.     Findings: Erythema present.     Comments: Diffuse swelling, warmth, mild tenderness of right lower periorbital area.  No discharge, crepitus, upper eyelid swelling.  No nasal bridge tenderness.  Neurological:     Mental Status: He is alert and oriented to person, place, and time.      UC Treatments / Results  Labs (all labs ordered are listed, but only abnormal results are  displayed) Labs Reviewed - No data to display  EKG   Radiology No results found.  Procedures Procedures (including critical care time)  Medications Ordered in UC Medications  Tdap (BOOSTRIX) injection 0.5 mL (0.5 mLs Intramuscular Given 06/23/20 1033)    Initial Impression / Assessment and Plan / UC Course  I have reviewed the triage vital signs and the nursing notes.  Pertinent labs & imaging results that were available during my care of the patient were reviewed by me and considered in my medical decision making (see chart for details).     Patient afebrile, nontoxic in office today.  Is hypertensive, though has PCP appointment pending to establish care and evaluate/manage further.  Denying chest pain, difficulty breathing.  Tetanus updated, will start Augmentin.  Low concern for preseptal/septal cellulitis at this time and patient has had elevated liver enzymes in the past: We will avoid Bactrim.  Provided ophthalmologic contact information for any visual changes.  Return precautions discussed, pt verbalized understanding and is agreeable to plan. Final Clinical Impressions(s) / UC Diagnoses   Final diagnoses:  Periorbital cellulitis of right eye     Discharge Instructions     Keep area(s) clean and dry. Apply hot compress / towel for 5-10 minutes 3-5 times daily. Take antibiotic as prescribed with food - important to complete course. Return for worsening pain, redness, swelling, discharge, fever.  Helpful prevention tips: Keep nails short to avoid secondary skin infections. Use new, clean razors when shaving. Avoid antiperspirants - look for deodorants without aluminum. Avoid wearing underwire bras as this can irritate the area further.     ED Prescriptions    Medication Sig Dispense Auth. Provider   amoxicillin-clavulanate (AUGMENTIN) 875-125 MG tablet Take 1 tablet by mouth every 12 (twelve) hours. 14 tablet Hall-Potvin, [place], PA-C     PDMP not reviewed  this encounter.   Hall-Potvin, [place], [place] 06/23/20 1055

## 2020-06-28 ENCOUNTER — Telehealth: Payer: Self-pay | Admitting: Emergency Medicine

## 2020-06-28 MED ORDER — SULFAMETHOXAZOLE-TRIMETHOPRIM 800-160 MG PO TABS
1.0000 | ORAL_TABLET | Freq: Two times a day (BID) | ORAL | 0 refills | Status: AC
Start: 2020-06-28 — End: 2020-07-01

## 2020-06-28 NOTE — Telephone Encounter (Signed)
Patient called requesting change in antibiotics: See 9/2 for periorbital cellulitis.  Given Augmentin.  Started this Thursday: Largely compliant with twice daily dosing.  States he developed pruritic rash to shoulders, chest, trunk.  No swelling of lips, tongue, throat, difficulty breathing, diarrhea or vomiting.  Will discontinue Augmentin, start Bactrim, keep follow-up with PCP on Thursday.  Patient does state that eye swelling, redness, pain has improved.  No visual changes.  Confirmed pharmacy: Sent.

## 2020-07-31 ENCOUNTER — Other Ambulatory Visit: Payer: Self-pay

## 2020-07-31 ENCOUNTER — Encounter: Payer: Self-pay | Admitting: *Deleted

## 2020-07-31 ENCOUNTER — Ambulatory Visit
Admission: EM | Admit: 2020-07-31 | Discharge: 2020-07-31 | Disposition: A | Discharge status: Home / Self Care | Payer: 59 | Attending: Emergency Medicine | Admitting: Emergency Medicine

## 2020-07-31 DIAGNOSIS — L03213 Periorbital cellulitis: Secondary | ICD-10-CM

## 2020-07-31 HISTORY — DX: Periorbital cellulitis: L03.213

## 2020-07-31 HISTORY — DX: Essential (primary) hypertension: I10

## 2020-07-31 MED ORDER — CEFDINIR 300 MG PO CAPS
300.0000 mg | ORAL_CAPSULE | Freq: Two times a day (BID) | ORAL | 0 refills | Status: AC
Start: 2020-07-31 — End: 2020-08-07

## 2020-07-31 MED ORDER — CLINDAMYCIN HCL 300 MG PO CAPS: 300.0000 mg | ORAL_CAPSULE | Freq: Three times a day (TID) | ORAL | 0 refills | Status: AC

## 2020-07-31 NOTE — ED Provider Notes (Signed)
[name]    CSN: [id number] Arrival date & time: 07/31/20  1014      History   Chief Complaint Chief Complaint  Patient presents with  . Eye Problem    HPI David Cordova is a 39 y.o. male  Presenting for persistent right eye rash. Patient seen 06/23/2020 by me for this: Please see those records. Patient has since followed up with PCP: Given Bactrim, though states it would not extend his course. Primary care recommended dermatologist, though states he cannot get in until February. Denies visual changes, fever, eye pain, photophobia. Overall feels well.  Past Medical History:  Diagnosis Date  . Acute cholecystitis 01/28/2012   Presented with acute cholecystitis, underwent Lap chole, IOC on 01/28/12   . ADD (attention deficit disorder)   . Hypertension   . Periorbital cellulitis of right eye   . Seasonal allergies     There are no problems to display for this patient.   Past Surgical History:  Procedure Laterality Date  . CHOLECYSTECTOMY  01/28/2012   Procedure: LAPAROSCOPIC CHOLECYSTECTOMY WITH INTRAOPERATIVE CHOLANGIOGRAM;  Surgeon: Currie Paris, MD;  Location: [facility];  Service: General;  Laterality: N/A;       Home Medications    Prior to Admission medications   Medication Sig Start Date End Date Taking? Authorizing Provider  amLODipine (NORVASC) 5 MG tablet Take 1 tablet (5 mg total) by mouth daily. 04/10/20  Yes Hall-Potvin, [place], PA-C  Multiple Vitamin (MULITIVITAMIN WITH MINERALS) TABS Take 1 tablet by mouth daily.   Yes [provider]  VYVANSE 70 MG capsule  06/10/17  Yes [provider]  albuterol (VENTOLIN HFA) 108 (90 Base) MCG/ACT inhaler Inhale 2 puffs into the lungs every 4 (four) hours as needed for wheezing or shortness of breath. 04/10/20   Hall-Potvin, [place], PA-C  cefdinir (OMNICEF) 300 MG capsule Take 1 capsule (300 mg total) by mouth 2 (two) times daily for 7 days. 07/31/20 08/07/20  Hall-Potvin, [place],  PA-C  clindamycin (CLEOCIN) 300 MG capsule Take 1 capsule (300 mg total) by mouth 3 (three) times daily for 7 days. 07/31/20 08/07/20  Hall-Potvin, [place], PA-C    Family History Family History  Problem Relation Age of Onset  . Healthy Mother   . Hypertension Father     Social History Social History   Tobacco Use  . Smoking status: Former Smoker    Quit date: 10/23/2003    Years since quitting: 16.7  . Smokeless tobacco: Never Used  Vaping Use  . Vaping Use: Never used  Substance Use Topics  . Alcohol use: Yes    Comment: occasional  . Drug use: No     Allergies   Augmentin [amoxicillin-pot clavulanate]   Review of Systems As per HPI   Physical Exam Triage Vital Signs ED Triage Vitals  Enc Vitals Group     BP      Pulse      Resp      Temp      Temp src      SpO2      Weight      Height      Head Circumference      Peak Flow      Pain Score      Pain Loc      Pain Edu?      Excl. in GC?    No data found.  Updated Vital Signs BP (!) 158/99   Pulse (!) 119   Temp  98.1 F (36.7 C) (Oral)   Resp 18   SpO2 98%   Visual Acuity Right Eye Distance: 20/20 Left Eye Distance: 20/20 -1 Bilateral Distance: 20/20  Right Eye Near:   Left Eye Near:    Bilateral Near:     Physical Exam Constitutional:      General: He is not in acute distress. HENT:     Head: Normocephalic and atraumatic.  Eyes:     General: No scleral icterus.       Right eye: No discharge.        Left eye: No discharge.     Extraocular Movements: Extraocular movements intact.     Conjunctiva/sclera: Conjunctivae normal.     Pupils: Pupils are equal, round, and reactive to light.  Cardiovascular:     Rate and Rhythm: Normal rate.  Pulmonary:     Effort: Pulmonary effort is normal. No respiratory distress.     Breath sounds: No wheezing.  Skin:    Coloration: Skin is not jaundiced or pale.     Findings: Erythema and rash present.     Comments: Right lower eyelid with  erythema, crusting, warmth and tenderness.  No active discharge, crepitus.  Neurological:     Mental Status: He is alert and oriented to person, place, and time.      UC Treatments / Results  Labs (all labs ordered are listed, but only abnormal results are displayed) Labs Reviewed - No data to display  EKG   Radiology No results found.  Procedures Procedures (including critical care time)  Medications Ordered in UC Medications - No data to display  Initial Impression / Assessment and Plan / UC Course  I have reviewed the triage vital signs and the nursing notes.  Pertinent labs & imaging results that were available during my care of the patient were reviewed by me and considered in my medical decision making (see chart for details).     Afebrile, nontoxic in office today. As compared to previous evaluation from 9/2, lesion appears somewhat worsened.  Patient is insistent that Bactrim almost resolved completely, though he "did not have them for long enough to make it go away ".  We will change coverage as per up-to-date guidelines to cefdinir, clindamycin.  Encouraged patient to schedule appointment with dermatology still; will follow up with ophthalmology for further evaluation in the interim. ER return precautions discussed, pt verbalized understanding and is agreeable to plan. Final Clinical Impressions(s) / UC Diagnoses   Final diagnoses:  Preseptal cellulitis of right lower eyelid   Discharge Instructions   None    ED Prescriptions    Medication Sig Dispense Auth. Provider   clindamycin (CLEOCIN) 300 MG capsule Take 1 capsule (300 mg total) by mouth 3 (three) times daily for 7 days. 21 capsule Hall-Potvin, [place], PA-C   cefdinir (OMNICEF) 300 MG capsule Take 1 capsule (300 mg total) by mouth 2 (two) times daily for 7 days. 14 capsule Hall-Potvin, [place], PA-C     PDMP not reviewed this encounter.   Hall-Potvin, [place], [place] 07/31/20 1121

## 2020-07-31 NOTE — ED Triage Notes (Addendum)
Pt was seen 06/23/20 for right eye perorbital cellulitis; states had allergic reaction to original abx, completed the changed abx and had f/u with PCP; states had been improving with Bactrim, but PCP would not call in additional doses of Bactrim.  Pt states right eye had been improving, but never completely resolved.  States his PCP recommended dermatologist, but cannot have appt until 11/2020.   Denies any known fevers.  Denies any vision changes. Crusty, red skin noted to below right eye.

## 2021-03-05 IMAGING — DX DG CHEST 2V
2 series · 2 of 2 positions shown · non-contrast
Comparison: None.

CLINICAL DATA: Cough.  Chills.

EXAM:
CHEST - 2 VIEW

[chest pa]
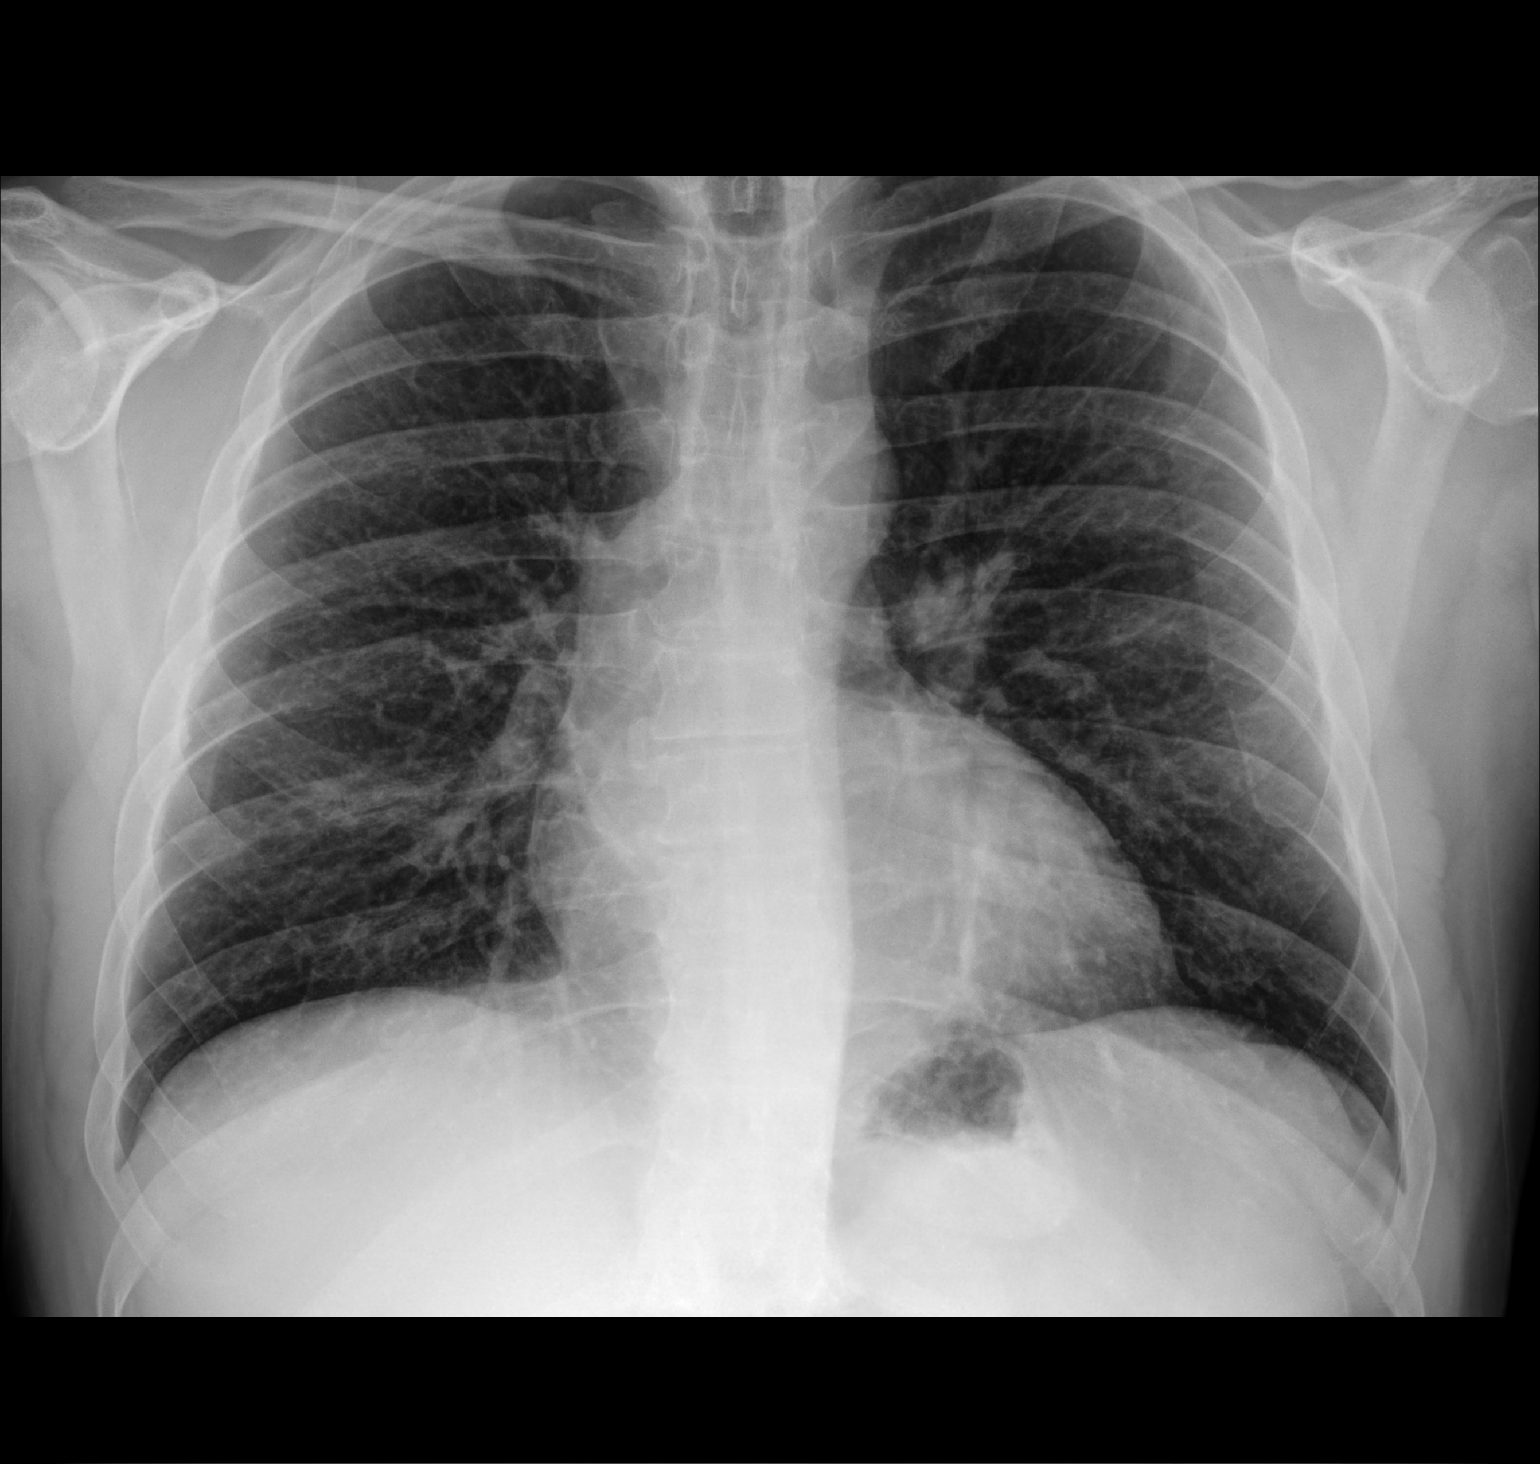

[chest lat]
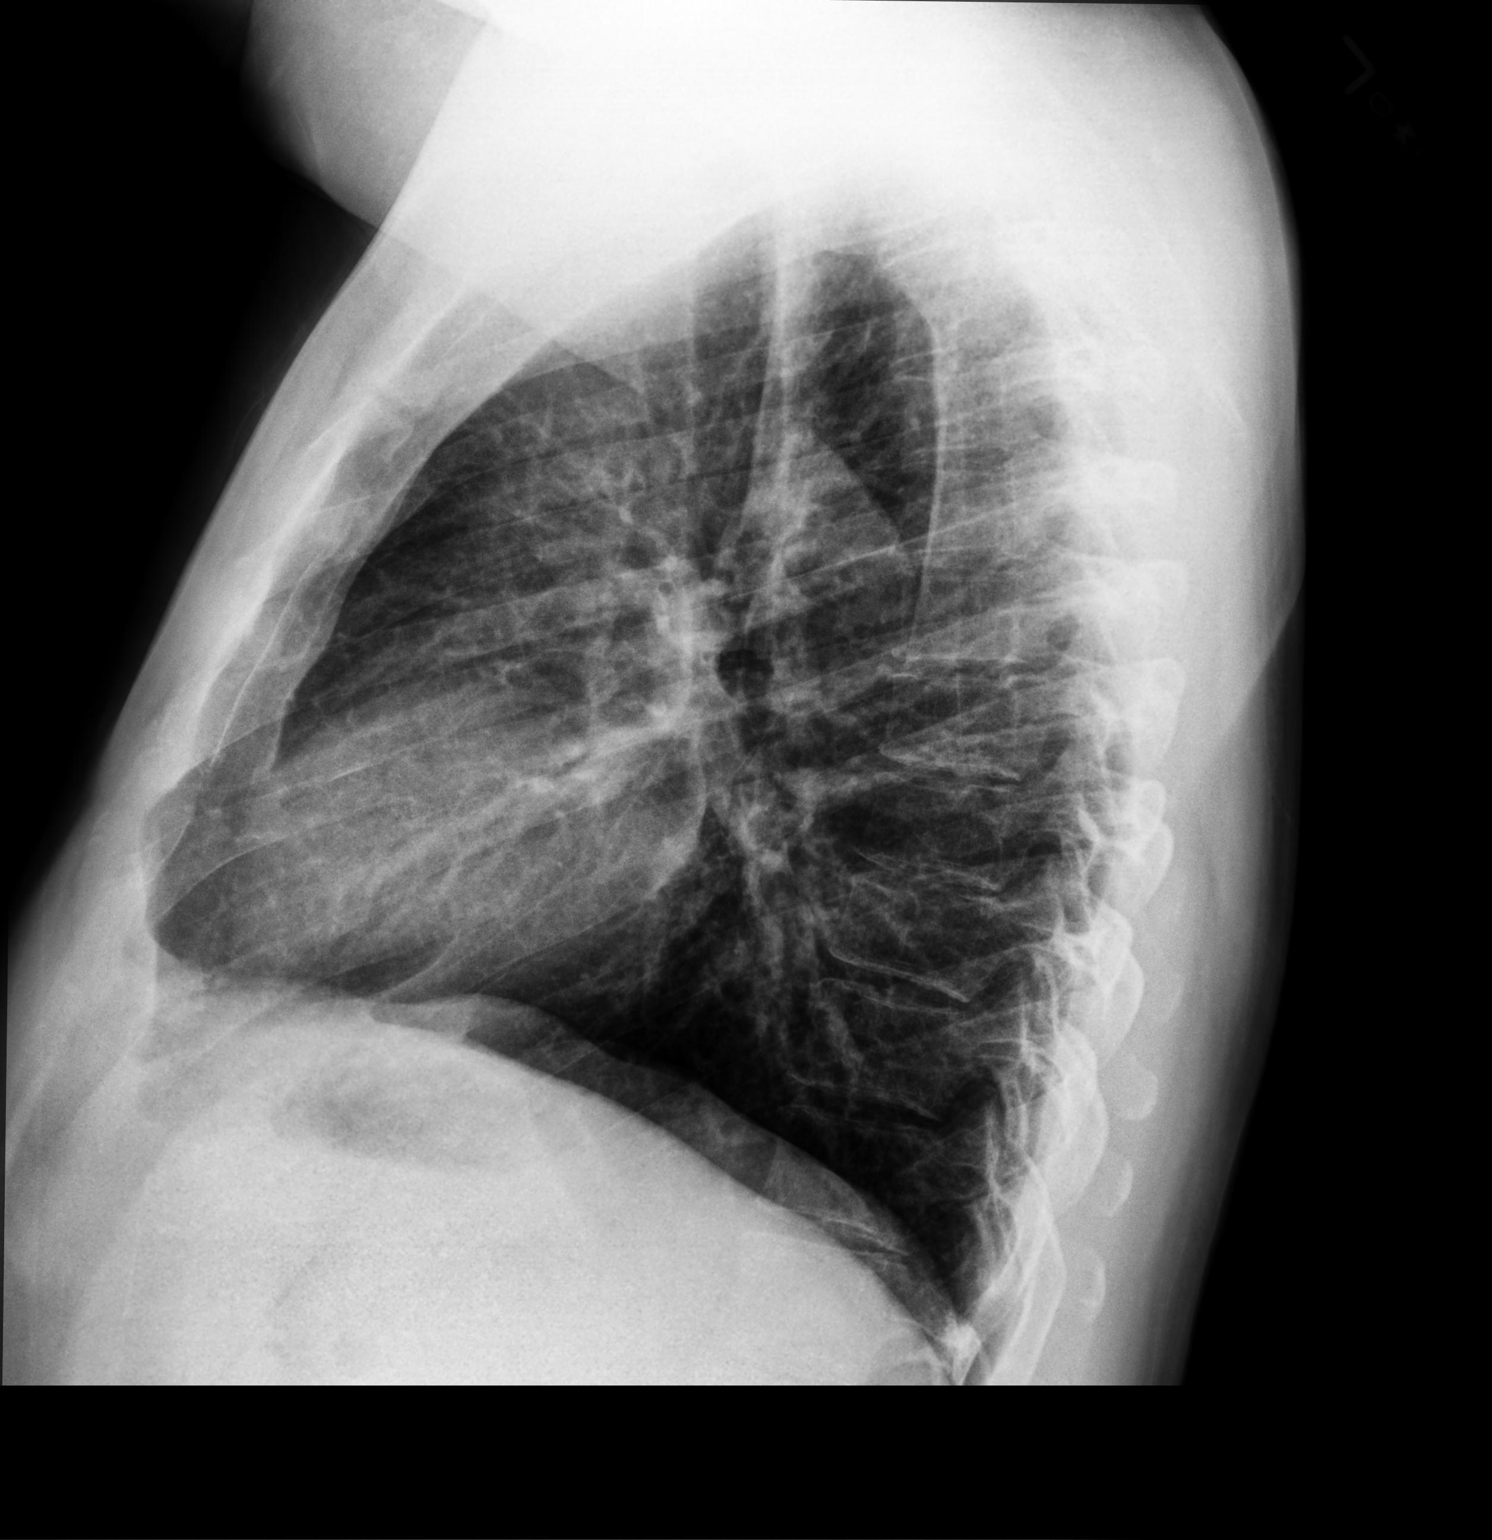

[2 of 2 positions shown; findings below may reference images not displayed]

FINDINGS: The heart size and mediastinal contours are within normal limits.
Both lungs are clear. The visualized skeletal structures are
unremarkable.
IMPRESSION: No active cardiopulmonary disease.

## 2024-03-23 ENCOUNTER — Ambulatory Visit (INDEPENDENT_AMBULATORY_CARE_PROVIDER_SITE_OTHER): Admitting: Otolaryngology

## 2024-03-23 ENCOUNTER — Encounter (INDEPENDENT_AMBULATORY_CARE_PROVIDER_SITE_OTHER): Payer: Self-pay | Admitting: Otolaryngology

## 2024-03-23 VITALS — BP 135/89 | HR 93

## 2024-03-23 DIAGNOSIS — J342 Deviated nasal septum: Secondary | ICD-10-CM | POA: Diagnosis not present

## 2024-03-23 DIAGNOSIS — R040 Epistaxis: Secondary | ICD-10-CM

## 2024-03-23 DIAGNOSIS — J329 Chronic sinusitis, unspecified: Secondary | ICD-10-CM

## 2024-03-23 DIAGNOSIS — J343 Hypertrophy of nasal turbinates: Secondary | ICD-10-CM | POA: Diagnosis not present

## 2024-03-23 DIAGNOSIS — R0982 Postnasal drip: Secondary | ICD-10-CM

## 2024-03-23 DIAGNOSIS — R0981 Nasal congestion: Secondary | ICD-10-CM

## 2024-03-23 DIAGNOSIS — J3489 Other specified disorders of nose and nasal sinuses: Secondary | ICD-10-CM

## 2024-03-23 DIAGNOSIS — J3089 Other allergic rhinitis: Secondary | ICD-10-CM

## 2024-03-23 MED ORDER — LEVOCETIRIZINE DIHYDROCHLORIDE 5 MG PO TABS: 5.0000 mg | ORAL_TABLET | Freq: Every evening | ORAL | 3 refills | Status: AC

## 2024-03-23 NOTE — Patient Instructions (Addendum)
Epistaxis prevention instructions given to the patient: - use nasal saline spray x6/day and Vaseline twice a day to prevent nose bleeds - for active nose bleeds use Afrin and nasal tip pressure x 10 min to stop them - if nose bleed does not stop with above measures, please go to Emergency Room  - please see your primary care provider to check your blood pressure and make sure it is under control - return for recurrent nose bleeds and we will consider cautery of your nasal blood vessels  - Purchase BleedStop to have at home and help with epistaxis control    Lloyd Huger Med Nasal Saline Rinse   - start nasal saline rinses with [employer] available over the counter or online to help with nasal congestion

## 2024-03-23 NOTE — Progress Notes (Signed)
 ENT CONSULT:  Reason for Consult: chronic nasal congestion and epistaxis (both sides)   HPI: Discussed the use of AI scribe software for clinical note transcription with the patient, who gave verbal consent to proceed.  History of Present Illness David Cordova is a 43 year old male who presents with chronic nasal congestion and frequent low-volume self-resolving epistaxis.  He has been experiencing chronic nasal congestion and daily epistaxis for an extended period. The congestion feels obstructive and persistent, with the sensation of something in his nasal passages. The epistaxis alternates between nostrils and varies but he never required cautery or nasal packing. He manages the bleeding by packing his nose with tissue.   He is on medication for cholesterol and blood pressure, but not on any blood thinners. He denies taking supplements like fish oil, only a multivitamin. He has not undergone nasal packing by a healthcare provider and has not been tested for allergies, although he notes that pine pollen can be irritating but not significantly problematic.  The congestion is described as chronic, with occasional temporary relief. It was previously affecting his throat but is now localized to his nose. He perceives a sensation of movement in his nose when epistaxis occurs and scabs break. He has not had any prior scans or imaging studies to evaluate this issue.     Past Medical History:  Diagnosis Date   Acute cholecystitis 01/28/2012   Presented with acute cholecystitis, underwent Lap chole, IOC on 01/28/12    ADD (attention deficit disorder)    Hypertension    Periorbital cellulitis of right eye    Seasonal allergies     Past Surgical History:  Procedure Laterality Date   CHOLECYSTECTOMY  01/28/2012   Procedure: LAPAROSCOPIC CHOLECYSTECTOMY WITH INTRAOPERATIVE CHOLANGIOGRAM;  Surgeon: Darcella Earnest, MD;  Location: [facility];  Service: General;  Laterality: N/A;    Family History   Problem Relation Age of Onset   Healthy Mother    Hypertension Father     Social History:  reports that he quit smoking about 20 years ago. He has never used smokeless tobacco. He reports current alcohol use. He reports that he does not use drugs.  Allergies:  Allergies  Allergen Reactions   Augmentin  [Amoxicillin -Pot Clavulanate] Rash    Medications: I have reviewed the patient's current medications.   The PMH, PSH, Medications, Allergies, and SH were reviewed and updated.  ROS: Constitutional: Negative for fever, weight loss and weight gain. Cardiovascular: Negative for chest pain and dyspnea on exertion. Respiratory: Is not experiencing shortness of breath at rest. Gastrointestinal: Negative for nausea and vomiting. Neurological: Negative for headaches. Psychiatric: The patient is not nervous/anxious  Blood pressure 135/89, pulse 93, SpO2 97%.  PHYSICAL EXAM:  Exam: General: Well-developed, well-nourished Communication and Voice: Clear pitch and clarity Respiratory Respiratory effort: Equal inspiration and expiration without stridor Cardiovascular Peripheral Vascular: Warm extremities with equal color/perfusion Eyes: No nystagmus with equal extraocular motion bilaterally Neuro/Psych/Balance: Patient oriented to person, place, and time; Appropriate mood and affect; Gait is intact with no imbalance; Cranial nerves I-XII are intact Head and Face Inspection: Normocephalic and atraumatic without mass or lesion Palpation: Facial skeleton intact without bony stepoffs Salivary Glands: No mass or tenderness Facial Strength: Facial motility symmetric and full bilaterally ENT Pinna: External ear intact and fully developed External canal: Canal is patent with intact skin Tympanic Membrane: Clear and mobile External Nose: No scar or anatomic deformity Internal Nose: Septum is deviated to the right with significant narrowing of the right  nasal passage. No polyp, or purulence.  Mucosal edema and erythema present.  Bilateral inferior turbinate hypertrophy. No blood or clot or prominent blood vessels along the septum or anywhere else. No lesions.  Lips, Teeth, and gums: Mucosa and teeth intact and viable TMJ: No pain to palpation with full mobility Oral cavity/oropharynx: No erythema or exudate, no lesions present Nasopharynx: No mass or lesion with intact mucosa Neck Neck and Trachea: Midline trachea without mass or lesion Thyroid: No mass or nodularity Lymphatics: No lymphadenopathy  Procedure:   PROCEDURE NOTE: nasal endoscopy  Preoperative diagnosis: recurrent epistaxis and chronic nasal congestion    Postoperative diagnosis: same  Procedure: Diagnostic nasal endoscopy (57846)  Surgeon: Artice Last, M.D.  Anesthesia: Topical lidocaine  and Afrin  H&P REVIEW: The patient's history and physical were reviewed today prior to procedure. All medications were reviewed and updated as well. Complications: None Condition is stable throughout exam Indications and consent: The patient presents with symptoms of chronic sinusitis not responding to previous therapies. All the risks, benefits, and potential complications were reviewed with the patient preoperatively and informed consent was obtained. The time out was completed with confirmation of the correct procedure.   Procedure: The patient was seated upright in the clinic. Topical lidocaine  and Afrin were applied to the nasal cavity. After adequate anesthesia had occurred, the rigid nasal endoscope was passed into the nasal cavity. The nasal mucosa, turbinates, septum, and sinus drainage pathways were visualized bilaterally. This revealed no purulence or significant secretions that might be cultured. There were no polyps or sites of significant inflammation. The mucosa was intact and there was no crusting present. The scope was then slowly withdrawn and the patient tolerated the procedure well. There were no  complications or blood loss.    Studies Reviewed: CXR 2021 - normal   Assessment/Plan: Encounter Diagnoses  Name Primary?   Nasal septal deviation    Chronic nasal congestion Yes   Environmental and seasonal allergies    Post-nasal drip    Epistaxis    Hypertrophy of both inferior nasal turbinates    Chronic sinusitis, unspecified location    Nasal obstruction     Assessment and Plan Assessment & Plan Chronic nasal congestion with septal deviation/ITH significantly narrowing right side  Chronic nasal congestion due to right septal deviation. No nasal growths or tumors. Possible allergic inflammation noted as well but no pus or polyps. Alternating congestion likely from nasal cycle. CT scan to confirm absence of growths. - Order CT scan of sinuses to rule out CRS - Xyzal 5 mg daily - Recommend nasal saline rinses with NeilMed bottle. - will hold off on nasal spray 2/2 hx of epistaxis  - will consider allergy testing in the future    Frequent epistaxis Daily epistaxis possibly linked to nasal congestion and septal deviation and chronic nasal inflammation. Advised nasal saline rinses to reduce frequency. - Recommend nasal saline rinses with NeilMed bottle. - Provide instructions for managing epistaxis and epistaxis prevention   RTC 3 mo after CT max face    Thank you for allowing me to participate in the care of this patient. Please do not hesitate to contact me with any questions or concerns.   Artice Last, MD Otolaryngology [facility] Phone: [phone] Fax: [phone]    03/23/2024, 10:05 AM
